# Patient Record
Sex: Male | Born: 1975 | Race: White | Hispanic: No | Marital: Married | Smoking: Never smoker
Health system: Southern US, Community
[De-identification: ages and names within clinical notes are randomized; demographics above are authoritative.]

## PROBLEM LIST (undated history)

## (undated) DIAGNOSIS — F4310 Post-traumatic stress disorder, unspecified: Secondary | ICD-10-CM

## (undated) DIAGNOSIS — F32A Depression, unspecified: Secondary | ICD-10-CM

## (undated) DIAGNOSIS — R519 Headache, unspecified: Secondary | ICD-10-CM

## (undated) DIAGNOSIS — T8859XA Other complications of anesthesia, initial encounter: Secondary | ICD-10-CM

## (undated) DIAGNOSIS — G4730 Sleep apnea, unspecified: Secondary | ICD-10-CM

## (undated) DIAGNOSIS — J45909 Unspecified asthma, uncomplicated: Secondary | ICD-10-CM

## (undated) DIAGNOSIS — F329 Major depressive disorder, single episode, unspecified: Secondary | ICD-10-CM

## (undated) DIAGNOSIS — R7303 Prediabetes: Secondary | ICD-10-CM

## (undated) DIAGNOSIS — Z9284 Personal history of unintended awareness under general anesthesia: Secondary | ICD-10-CM

## (undated) DIAGNOSIS — K219 Gastro-esophageal reflux disease without esophagitis: Secondary | ICD-10-CM

## (undated) DIAGNOSIS — F419 Anxiety disorder, unspecified: Secondary | ICD-10-CM

## (undated) DIAGNOSIS — J189 Pneumonia, unspecified organism: Secondary | ICD-10-CM

## (undated) HISTORY — PX: OTHER SURGICAL HISTORY: SHX169

## (undated) HISTORY — PX: FASCIOTOMY: SHX132

---

## 2002-08-12 DIAGNOSIS — S069XAA Unspecified intracranial injury with loss of consciousness status unknown, initial encounter: Secondary | ICD-10-CM

## 2002-08-12 HISTORY — DX: Unspecified intracranial injury with loss of consciousness status unknown, initial encounter: S06.9XAA

## 2014-08-12 HISTORY — PX: UMBILICAL HERNIA REPAIR: SHX196

## 2015-05-22 DIAGNOSIS — M4722 Other spondylosis with radiculopathy, cervical region: Secondary | ICD-10-CM | POA: Insufficient documentation

## 2015-05-22 DIAGNOSIS — M542 Cervicalgia: Secondary | ICD-10-CM | POA: Insufficient documentation

## 2015-06-26 ENCOUNTER — Emergency Department (HOSPITAL_COMMUNITY)
Admission: EM | Admit: 2015-06-26 | Discharge: 2015-06-26 | Disposition: A | Discharge status: Home / Self Care | Payer: Non-veteran care | Attending: Emergency Medicine | Admitting: Emergency Medicine

## 2015-06-26 ENCOUNTER — Encounter (HOSPITAL_COMMUNITY): Payer: Self-pay | Admitting: *Deleted

## 2015-06-26 DIAGNOSIS — K0889 Other specified disorders of teeth and supporting structures: Secondary | ICD-10-CM | POA: Insufficient documentation

## 2015-06-26 DIAGNOSIS — Z8659 Personal history of other mental and behavioral disorders: Secondary | ICD-10-CM | POA: Insufficient documentation

## 2015-06-26 HISTORY — DX: Post-traumatic stress disorder, unspecified: F43.10

## 2015-06-26 HISTORY — DX: Depression, unspecified: F32.A

## 2015-06-26 HISTORY — DX: Anxiety disorder, unspecified: F41.9

## 2015-06-26 HISTORY — DX: Major depressive disorder, single episode, unspecified: F32.9

## 2015-06-26 MED ORDER — PENICILLIN V POTASSIUM 500 MG PO TABS: 500.0000 mg | ORAL_TABLET | Freq: Four times a day (QID) | ORAL | Status: AC

## 2015-06-26 MED ORDER — IBUPROFEN 800 MG PO TABS: 800.0000 mg | ORAL_TABLET | Freq: Three times a day (TID) | ORAL | Status: AC | PRN

## 2015-06-26 MED ORDER — ACETAMINOPHEN-CODEINE #3 300-30 MG PO TABS: 1.0000 | ORAL_TABLET | Freq: Four times a day (QID) | ORAL | Status: DC | PRN

## 2015-06-26 NOTE — ED Provider Notes (Signed)
CSN: [id number]     Arrival date & time 06/26/15  1305 History  By signing my name below, I, Ronney Lion, attest that this documentation has been prepared under the direction and in the presence of [facility], PA-C. Electronically Signed: Ronney Lion, ED Scribe. 06/26/2015. 4:28 PM.    Chief Complaint  Patient presents with  . Dental Pain   The history is provided by the patient. No language interpreter was used.    HPI Comments:  Curtis Christensen is a 39 y.o. male who presents to the Emergency Department complaining of gradual-onset, gradually worsening, constant, shooting, severe pain to his upper and bottom teeth after they began breaking and splitting over the past couple of months as part of a natural decay process, per patient. He states he had dental work done on these teeth at the [place], but "they did a poor job," and all the dental work seems to be breaking. He states the nerves are exposed, and exposure to air or cold/hot liquids exacerbate his pain. Patient states he has had to take ibuprofen every 6-8 hours, for his pain, with mild relief. Patient states he presents today because he cannot be seen again at the [place] until 07/05/15. He denies fever, sore throat, difficulty breathing, difficulty swallowing, facial swelling, or blood in his stool.   Past Medical History  Diagnosis Date  . PTSD (post-traumatic stress disorder)   . Anxiety   . Depression    History reviewed. No pertinent past surgical history. History reviewed. No pertinent family history. Social History  Substance Use Topics  . Smoking status: Unknown If Ever Smoked  . Smokeless tobacco: None  . Alcohol Use: No    Review of Systems  Constitutional: Negative for fever.  HENT: Positive for dental problem. Negative for facial swelling, sore throat and trouble swallowing.   Respiratory: Negative for shortness of breath.   Gastrointestinal: Negative for blood in stool.  Musculoskeletal: Negative for neck pain and neck  stiffness.  Skin: Negative for color change and rash.  Allergic/Immunologic: Negative for immunocompromised state.  Psychiatric/Behavioral: Negative for self-injury.   Allergies  Review of patient's allergies indicates no known allergies.  Home Medications   Prior to Admission medications   Not on File   BP 136/86 mmHg  Pulse 76  Temp(Src) 98.1 F (36.7 C) (Oral)  Resp 18  SpO2 95% Physical Exam  Constitutional: He appears well-developed and well-nourished. No distress.  HENT:  Head: Normocephalic and atraumatic.  Mouth/Throat: Uvula is midline and oropharynx is clear and moist. Mucous membranes are not dry. No uvula swelling. No oropharyngeal exudate, posterior oropharyngeal edema, posterior oropharyngeal erythema or tonsillar abscesses.  Right upper second molar with remote posterior fracture. Tenderness to strong percussion. Right lower molars with significant gingival recession. Non-tender. No facial swelling. No other right-sided molars present.  Neck: Normal range of motion. Neck supple.  Cardiovascular: Normal rate.   Pulmonary/Chest: Effort normal and breath sounds normal. No stridor.  Lymphadenopathy:    He has no cervical adenopathy.  Neurological: He is alert.  Skin: He is not diaphoretic.  Nursing note and vitals reviewed.   ED Course  Procedures (including critical care time)  DIAGNOSTIC STUDIES: Oxygen Saturation is 95% on RA, normal by my interpretation.    COORDINATION OF CARE: 2:10 PM - Discussed treatment plan with pt at bedside which includes antibiotics and pain-relieving medication. Pt verbalized understanding and agreed to plan.   MDM   Final diagnoses:  Pain, dental    Afebrile, nontoxic  patient with ongoing, progressive dental pain.  No obvious abscess.  No concerning findings on exam.  Doubt deep space head or neck infection.  Doubt Ludwig's angina.  D/C home with antibiotic, pain medication and dental follow up.  Discussed findings,  treatment, and follow up  with patient.  Pt given return precautions.  Pt verbalizes understanding and agrees with plan.       I personally performed the services described in this documentation, which was scribed in my presence. The recorded information has been reviewed and is accurate.     Trixie Dredgemily Chabely Norby, PA-C 06/26/15 2151  Gerhard Munchobert Lockwood, MD 06/27/15 [phone]

## 2015-06-26 NOTE — Discharge Instructions (Signed)
Read the information below.  Use the prescribed medication as directed.  Please discuss all new medications with your pharmacist.  You may return to the Emergency Department at any time for worsening condition or any new symptoms that concern you.   Please call the dentist listed above within 48 hours to schedule a close follow up appointment.  If you develop fevers, swelling in your face, difficulty swallowing or breathing, return to the ER immediately for a recheck.   ° ° °Emergency Department Resource Guide °1) Find a Doctor and Pay Out of Pocket °Although you won't have to find out who is covered by your insurance plan, it is a good idea to ask around and get recommendations. You will then need to call the office and see if the doctor you have chosen will accept you as a new patient and what types of options they offer for patients who are self-pay. Some doctors offer discounts or will set up payment plans for their patients who do not have insurance, but you will need to ask so you aren't surprised when you get to your appointment. ° °2) Contact Your Local Health Department °Not all health departments have doctors that can see patients for sick visits, but many do, so it is worth a call to see if yours does. If you don't know where your local health department is, you can check in your phone book. The CDC also has a tool to help you locate your state's health department, and many state websites also have listings of all of their local health departments. ° °3) Find a Walk-in Clinic °If your illness is not likely to be very severe or complicated, you may want to try a walk in clinic. These are popping up all over the country in pharmacies, drugstores, and shopping centers. They're usually staffed by nurse practitioners or physician assistants that have been trained to treat common illnesses and complaints. They're usually fairly quick and inexpensive. However, if you have serious medical issues or chronic  medical problems, these are probably not your best option. ° °No Primary Care Doctor: °- Call [facility] at  832-8000 - they can help you locate a primary care doctor that  accepts your insurance, provides certain services, etc. °- Physician Referral Service- 1-800-533-3463 ° °Chronic Pain Problems: °Organization         Address  Phone   Notes  °[facility]  (336) 297-2271 Patients need to be referred by their primary care doctor.  ° °Medication Assistance: °Organization         Address  Phone   Notes  °[place] [facility] [address] °[place], [place] [address] (336) 641-8030 --Must be a resident of [place] °-- Must have NO insurance coverage whatsoever (no Medicaid/ Medicare, etc.) °-- The pt. MUST have a primary care doctor that directs their care regularly and follows them in the community °  °[facility]  (866) 331-1348   °[employer]  (888) 892-1162   ° °Agencies that provide inexpensive medical care: °Organization         Address  Phone   Notes  °[facility]  (336) 832-8035   ° Internal Medicine    (336) 832-7272   °[facility] [address] °[place], [place] [address] (336) 832-4777   °[facility] [address], °[place] (336) 271-4999   °[facility]    (336) 373-0678   °[facility]    (336) 272-1050   °[facility] ° [address], [place] Phone:  (336) 832-4444, Fax:  (336) 832-4440 Hours of Operation:  9 am - 6 pm, M-F.  Also accepts Medicaid/Medicare and self-pay.  °[facility] ° [address], [place] Phone: (336) 832-3150, Fax: (336) 832-3151. Hours of Operation:  8:30 am - 5:30 pm, M-F.  Also accepts Medicaid and self-pay.  °[facility] [place] [address], [place] Phone: (336) 878-6027   °[facility] [address], [place], [place] (336)723-1848,  Ext. 123 Mondays & Thursdays: 7-9 AM.  First 15 patients are seen on a first come, first serve basis. °  ° °Medicaid-accepting [place] Providers: ° °Organization         Address  Phone   Notes  °[facility] [address], [place] (336) 641-2100 Also accepts self-pay patients.  °[facility] [address], [place] ° (336) 856-9996   °[facility] [address], [place] (336) 288-8857   °[facility] [address] [place] [address], [place] (336) 299-7000   °Veita Bland [address], [place]  ° (336) 373-1557 Only accepts [place] Access Medicaid patients after they have their name applied to their card.  ° °Self-Pay (no insurance) in [place]: ° °Organization         Address  Phone   Notes  °Sickle Cell Patients, [facility] [address], [place] (336) 832-1970   °[facility] [address], [place] (336) 832-4400   °[facility] [place] ° [address], [place] (336) 992-4800   °[facility]/Dr. Osei-Bonsu ° [address] [place] [address], [place] or [address], [place] (336) 841-8500 Phone number for both [place] and [place] locations is the same.  °[facility] [address], [place] (336) 299-0000   °[facility] [address] [place] [address],  or [address] (336) 852-7530 °(336) 878-2260   °[facility] [address],  (336) 350-1642, phone; (336) 294-5005, fax Sees patients 1st and 3rd Saturday of every month.  Must not qualify for public or private insurance (i.e. Medicaid, Medicare, [place] Health Choice, Veterans' Benefits) • Household income should be no more than 200% of the poverty level •The clinic cannot treat you if you are pregnant or think you are pregnant • Sexually transmitted diseases are not  treated at the clinic.  ° ° °Dental Care: °Organization         Address  Phone  Notes  °[facility] [facility] [address],  (336) 641-6152 Accepts children up to age 21 who are enrolled in Medicaid or [place] Health Choice; pregnant women with a Medicaid card; and children who have applied for Medicaid or [place] Health Choice, but were declined, whose parents can pay a reduced fee at time of service.  °[facility] [place]  [address], [place] (336) 641-7733 Accepts children up to age 21 who are enrolled in Medicaid or [place] Health Choice; pregnant women with a Medicaid card; and children who have applied for Medicaid or [place] Health Choice, but were declined, whose parents can pay a reduced fee at time of service.  °  [facility] ° [address], [place] (336) 641-4533 Patients are seen by appointment only. Walk-ins are not accepted. [facility] will see patients 18 years of age and older. °Monday - Tuesday (8am-5pm) °Most Wednesdays (8:30-5pm) °$30 per visit, cash only  °[facility] ° [address], [place] (336) 641-4533 Patients are seen by appointment only. Walk-ins are not accepted. [facility] will see patients 18 years of age and older. °One Wednesday Evening (Monthly: Volunteer Based).  $30 per visit, cash only  °[facility]  (919) 537-3737 for adults; Children under age 4, call Graduate Pediatric Dentistry at (919) [phone]. Children aged 4-14, please call (919) 537-3737 to request a pediatric application. ° Dental services are provided in all areas of dental care including fillings, crowns and bridges, complete and partial dentures, implants, gum treatment, root canals, and extractions. Preventive care is also provided. Treatment is provided to both adults and children. °Patients are selected via a lottery and there is  often a waiting list. °  °[facility] [address], °[place] ° (336) 763-8833 [email or web address] °  °[facility] [address], [place], [place] (336)723-1848, Ext. 123 Second and Fourth Thursday of each month, opens at 6:30 AM; Clinic ends at 9 AM.  Patients are seen on a first-come first-served basis, and a limited number are seen during each clinic.  ° °[facility] ° [address], [place], [place] (336) 723-7904   Eligibility Requirements °You must have lived in [place], [place], or [place] counties for at least the last three months. °  You cannot be eligible for state or federal sponsored healthcare insurance, including Veterans Administration, Medicaid, or Medicare. °  You generally cannot be eligible for healthcare insurance through your employer.  °  How to apply: °Eligibility screenings are held every Tuesday and Wednesday afternoon from 1:00 pm until 4:00 pm. You do not need an appointment for the interview!  °[facility] [address], [place], [place] 336-631-2330   °[facility] [facility]  336-342-8273   °[facility]  336-703-3100   °[facility]  336-570-6415   ° °Behavioral Health Resources in the Community: °Intensive Outpatient Programs °Organization         Address  Phone  Notes  °[facility] [address], [place], [place] 336-878-6098   °[facility] [address], [place], [place] 336-832-9800   °[facility] [address], [place], [place] ° 336-882-2125   °[facility] [address],  °[place], [place] 1-800-853-5163 or 336-641-4981   °Substance Abuse Resources °Organization         Address  Phone  Notes  °[facility]  336-882-2125   °[facility]  336-784-9470   °[facility]  336-285-9073   °[facility]  336-845-3988   °Residential & Outpatient Substance  Abuse Program  1-800-659-3381   °Psychological Services °Organization         Address  Phone  Notes  °[facility]  336- 832-9600   °[facility]  336- 378-7881   °[facility] [address], [place] 1-800-853-5163 or 336-641-4981   ° °Mobile Crisis Teams °Organization         Address  Phone  Notes  °[facility], Mobile Crisis Care Unit  1-877-626-1772   °Assertive °Psychotherapeutic Services ° [address], [place] 336-834-9664   °Sharon DeEsch [address] °[place] 336-554-5454   ° °Self-Help/Support Groups °Organization         Address  Phone             Notes  °[facility] [place] - variety of support groups  336- 373-1402 Call for more information  °Narcotics Anonymous (NA), [facility] [address], °[place]  2 meetings at this location  ° °Residential Treatment Programs °Organization         Address  Phone  Notes  °[facility] [address],    °[place]  1-866-801-8205   °[facility] ° [address], [place], [place] 704-293-8524   °[facility] [address], [place] 336-845-3988 Admissions: 8am-3pm M-F  °[facility] [address]    °[place], [place] 336-841-1104   °[facility] [address], [place], [place] 336-379-7146   °[facility] [address]  °[place], [place] 336-285-9073   °[facility] - Intensive Outpatient [address], [place], [place] 336-852-3033   °[facility] [address]  °[place], [place] 1-877-615-2722 or 336-784-9470   °[facility] [address] [place], [place] 336-227-7417 Accepts Medicaid  °[facility] [address]  ° [place] 1-800-659-3381 Substance Abuse/Addiction Treatment  ° °[place] Behavioral Health Resources °Organization          Address  Phone  Notes  °[facility]  (888) 581-9988   °Julie Brannon, PhD [address] [place], [place]   (336) 349-5553 or (336) 951-0000   °[facility]   [address] °[place], [place] (336) 349-4454   °[facility] [address], [place], [place] (336) 342-8316 Insurance/Medicaid/sponsorship through [facility]  °[facility] [address]                                    [place], [place] (336) 342-8316 Therapy/tele-psych/case  °[facility] [address]  ° [place], [place] (336) 349-2233    °Dr. Arfeen  (336) 349-4544   °[facility] [place]  [employer] [place] [facility] 1) [address], [place] °2) [address], [place] °3)  [address], [place] (336) 349-3220 °(336) 342-7768 ° °(336) 342-8140   °[place] Child Abuse [facility] (336) 342-1394 or (336) 342-3537 (After Hours)    ° ° ° °

## 2018-07-28 DIAGNOSIS — G5601 Carpal tunnel syndrome, right upper limb: Secondary | ICD-10-CM | POA: Insufficient documentation

## 2019-08-13 HISTORY — PX: CERVICAL FUSION: SHX112

## 2019-12-05 ENCOUNTER — Emergency Department
Admission: EM | Admit: 2019-12-05 | Discharge: 2019-12-05 | Disposition: A | Discharge status: Home / Self Care | Payer: No Typology Code available for payment source | Attending: Emergency Medicine | Admitting: Emergency Medicine

## 2019-12-05 ENCOUNTER — Other Ambulatory Visit: Payer: Self-pay

## 2019-12-05 DIAGNOSIS — Y9389 Activity, other specified: Secondary | ICD-10-CM | POA: Diagnosis not present

## 2019-12-05 DIAGNOSIS — Y999 Unspecified external cause status: Secondary | ICD-10-CM | POA: Insufficient documentation

## 2019-12-05 DIAGNOSIS — T1591XA Foreign body on external eye, part unspecified, right eye, initial encounter: Secondary | ICD-10-CM

## 2019-12-05 DIAGNOSIS — T1501XA Foreign body in cornea, right eye, initial encounter: Secondary | ICD-10-CM | POA: Insufficient documentation

## 2019-12-05 DIAGNOSIS — Y929 Unspecified place or not applicable: Secondary | ICD-10-CM | POA: Insufficient documentation

## 2019-12-05 DIAGNOSIS — X58XXXA Exposure to other specified factors, initial encounter: Secondary | ICD-10-CM | POA: Diagnosis not present

## 2019-12-05 DIAGNOSIS — H5711 Ocular pain, right eye: Secondary | ICD-10-CM | POA: Diagnosis present

## 2019-12-05 MED ORDER — TETRACAINE HCL 0.5 % OP SOLN
2.0000 [drp] | Freq: Once | OPHTHALMIC | Status: DC
  Filled 2019-12-05: qty 4

## 2019-12-05 MED ORDER — POLYMYXIN B-TRIMETHOPRIM 10000-0.1 UNIT/ML-% OP SOLN: 2.0000 [drp] | Freq: Four times a day (QID) | OPHTHALMIC | 0 refills | Status: DC

## 2019-12-05 MED ORDER — FLUORESCEIN SODIUM 1 MG OP STRP
1.0000 | ORAL_STRIP | Freq: Once | OPHTHALMIC | Status: DC
  Filled 2019-12-05: qty 1

## 2019-12-05 MED ORDER — KETOROLAC TROMETHAMINE 0.5 % OP SOLN: 1.0000 [drp] | Freq: Four times a day (QID) | OPHTHALMIC | 0 refills | Status: DC

## 2019-12-05 NOTE — Discharge Instructions (Signed)
Use the antibiotic eyedrop, 20 to 30 minutes later use the Acular drops for symptom improvement.  After 20 or 30 minutes you may use as much Visine/normal saline as you would like for additional symptom relief.  Foreign body sensation will probably last 48 to 72 hours.  If you have any further difficulty, ongoing foreign body sensation, pain follow-up with ophthalmology as needed.

## 2019-12-05 NOTE — ED Notes (Signed)
Pt states that he feels like he has something in his right eye- pt states that he works with [employer] and may have wiped his eye and gotten a piece of metal in there- pt states that his wife could not see anything in it- pt right eye red and slightly swollen

## 2019-12-05 NOTE — ED Provider Notes (Signed)
[facility] Emergency Department Provider Note  ____________________________________________  Time seen: Approximately 5:39 PM  I have reviewed the triage vital signs and the nursing notes.   HISTORY  Chief Complaint Eye Pain    HPI Curtis Christensen is a 44 y.o. male who presents the emergency department complaining of right eye pain/foreign body sensation.  Patient states that he was using a grinding wheel while working on his truck yesterday.  Patient has safety glasses on but states that he believes that a piece of metal may have bounced underneath the glass and got in his eye.  Patient does not wear glasses or contacts normally.  No vision changes.  Patient has a foreign body sensation in the superior aspect of his eye.  Patient has had increased tearing but no drainage.  No other complaints.         Past Medical History:  Diagnosis Date  . Anxiety   . Depression   . PTSD (post-traumatic stress disorder)     There are no problems to display for this patient.   History reviewed. No pertinent surgical history.  Prior to Admission medications   Medication Sig Start Date End Date Taking? Authorizing Provider  acetaminophen-codeine (TYLENOL #3) 300-30 MG tablet Take 1-2 tablets by mouth every 6 (six) hours as needed for moderate pain. 06/26/15   Trixie Dredge, PA-C  ibuprofen (ADVIL,MOTRIN) 800 MG tablet Take 1 tablet (800 mg total) by mouth every 8 (eight) hours as needed for mild pain or moderate pain. 06/26/15   Trixie Dredge, PA-C  ketorolac (ACULAR) 0.5 % ophthalmic solution Place 1 drop into the right eye 4 (four) times daily. 12/05/19   Pete Merten, Delorise Royals, PA-C  trimethoprim-polymyxin b (POLYTRIM) ophthalmic solution Place 2 drops into the right eye every 6 (six) hours. 12/05/19   Zaineb Nowaczyk, Delorise Royals, PA-C    Allergies Patient has no known allergies.  No family history on file.  Social History Social History   Tobacco Use  . Smoking status:  Never Smoker  . Smokeless tobacco: Never Used  Substance Use Topics  . Alcohol use: No  . Drug use: No     Review of Systems  Constitutional: No fever/chills Eyes: Possible foreign body in the right eye ENT: No upper respiratory complaints. Cardiovascular: no chest pain. Respiratory: no cough. No SOB. Gastrointestinal: No abdominal pain.  No nausea, no vomiting.  No diarrhea.  No constipation. Musculoskeletal: Negative for musculoskeletal pain. Skin: Negative for rash, abrasions, lacerations, ecchymosis. Neurological: Negative for headaches, focal weakness or numbness. 10-point ROS otherwise negative.  ____________________________________________   PHYSICAL EXAM:  VITAL SIGNS: ED Triage Vitals  Enc Vitals Group     BP 12/05/19 1623 (!) 153/88     Pulse Rate 12/05/19 1623 77     Resp 12/05/19 1623 16     Temp 12/05/19 1623 98.5 F (36.9 C)     Temp Source 12/05/19 1623 Oral     SpO2 12/05/19 1623 98 %     Weight 12/05/19 1621 215 lb (97.5 kg)     Height 12/05/19 1621 5\' 7"  (1.702 m)     Head Circumference --      Peak Flow --      Pain Score 12/05/19 1621 3     Pain Loc --      Pain Edu? --      Excl. in GC? --      Constitutional: Alert and oriented. Well appearing and in no acute distress. Eyes: Conjunctivae are normal.  PERRL. EOMI. patient's eye is anesthetized using tetracaine.  Fluorescein staining applied over the area of uptake around visible foreign body in the 11:30 position.  No other areas of uptake.  This area is visualized on funduscopic exam as well.  Funduscopic exam reveals good red reflex, vasculature and optic disc is intact.  No evidence of hyphema. Head: Atraumatic. ENT:      Ears:       Nose: No congestion/rhinnorhea.      Mouth/Throat: Mucous membranes are moist.  Neck: No stridor.    Cardiovascular: Normal rate, regular rhythm. Normal S1 and S2.  Good peripheral circulation. Respiratory: Normal respiratory effort without tachypnea or  retractions. Lungs CTAB. Good air entry to the bases with no decreased or absent breath sounds. Musculoskeletal: Full range of motion to all extremities. No gross deformities appreciated. Neurologic:  Normal speech and language. No gross focal neurologic deficits are appreciated.  Skin:  Skin is warm, dry and intact. No rash noted. Psychiatric: Mood and affect are normal. Speech and behavior are normal. Patient exhibits appropriate insight and judgement.   ____________________________________________   LABS (all labs ordered are listed, but only abnormal results are displayed)  Labs Reviewed - No data to display ____________________________________________  EKG   ____________________________________________  RADIOLOGY   No results found.  ____________________________________________    PROCEDURES  Procedure(s) performed:    .Foreign Body Removal  Date/Time: 12/05/2019 5:42 PM Performed by: Racheal Patches, PA-C Authorized by: Racheal Patches, PA-C  Consent: Verbal consent obtained. Risks and benefits: risks, benefits and alternatives were discussed Consent given by: patient Patient understanding: patient states understanding of the procedure being performed Patient identity confirmed: verbally with patient Time out: Immediately prior to procedure a "time out" was called to verify the correct patient, procedure, equipment, support staff and site/side marked as required. Body area: eye Location details: right cornea  Anesthesia: Local Anesthetic: tetracaine drops  Sedation: Patient sedated: no  Patient restrained: no Patient cooperative: yes Localization method: eyelid eversion and visualized Removal mechanism: 25-gauge needle Eye examined with fluorescein. Fluorescein uptake. Corneal abrasion size: small Corneal abrasion location: superior Residual rust ring present. Residual rust ring removed. Dressing: antibiotic drops Depth:  embedded Complexity: simple 1 objects recovered. Objects recovered: small metal fb Post-procedure assessment: foreign body removed Patient tolerance: patient tolerated the procedure well with no immediate complications Comments: Small piece of metal identified in the 11:30 position of the eye over the iris itself.  Small area of uptake around this foreign body.  No other fluorescein uptake.  Foreign body is successfully removed using 25-gauge needle.  There was a residual rust ring but this was also removed with 25-gauge needle..  Patient tolerated well no complications.      Medications  tetracaine (PONTOCAINE) 0.5 % ophthalmic solution 2 drop (has no administration in time range)  fluorescein ophthalmic strip 1 strip (has no administration in time range)     ____________________________________________   INITIAL IMPRESSION / ASSESSMENT AND PLAN / ED COURSE  Pertinent labs & imaging results that were available during my care of the patient were reviewed by me and considered in my medical decision making (see chart for details).  Review of the [place] CSRS was performed in accordance of the [facility] prior to dispensing any controlled drugs.           Patient's diagnosis is consistent with foreign body of the right eye.  Patient presented to emergency department complaining of right eye pain/foreign body sensation.  Foreign body was  visualized in the 11:30 position of the right eye.  Foreign body was successfully removed as well as a small rust ring.  Patient tolerated well no complications.  I have instructed the patient on the use of his prescribed medications and to follow-up with ophthalmology as needed. Patient is given ED precautions to return to the ED for any worsening or new symptoms.     ____________________________________________  FINAL CLINICAL IMPRESSION(S) / ED DIAGNOSES  Final diagnoses:  Foreign body of right eye, initial encounter      NEW MEDICATIONS STARTED  DURING THIS VISIT:  ED Discharge Orders         Ordered    trimethoprim-polymyxin b (POLYTRIM) ophthalmic solution  Every 6 hours     12/05/19 1748    ketorolac (ACULAR) 0.5 % ophthalmic solution  4 times daily     12/05/19 1748              This chart was dictated using voice recognition software/Dragon. Despite best efforts to proofread, errors can occur which can change the meaning. Any change was purely unintentional.    Darletta Moll, PA-C 12/05/19 1749    Blake Divine, MD 12/06/19 [phone]

## 2019-12-05 NOTE — ED Triage Notes (Signed)
Pt reports yesterday he was grinding things under his truck and some of the sparks got into right eye - Pt feels like "something" is in his eye - the right eye is red/irritated

## 2019-12-05 NOTE — ED Notes (Signed)
Pt did not have glasses for visual acuity   

## 2020-08-30 DIAGNOSIS — M5010 Cervical disc disorder with radiculopathy, unspecified cervical region: Secondary | ICD-10-CM | POA: Insufficient documentation

## 2020-11-29 DIAGNOSIS — R030 Elevated blood-pressure reading, without diagnosis of hypertension: Secondary | ICD-10-CM | POA: Insufficient documentation

## 2021-01-18 ENCOUNTER — Other Ambulatory Visit: Payer: Self-pay

## 2021-01-18 ENCOUNTER — Ambulatory Visit (INDEPENDENT_AMBULATORY_CARE_PROVIDER_SITE_OTHER)
Admission: EM | Admit: 2021-01-18 | Discharge: 2021-01-18 | Disposition: A | Discharge status: Home / Self Care | Payer: No Typology Code available for payment source | Source: Home / Self Care

## 2021-01-18 ENCOUNTER — Encounter (HOSPITAL_COMMUNITY): Payer: Self-pay | Admitting: Pharmacy Technician

## 2021-01-18 ENCOUNTER — Emergency Department (HOSPITAL_COMMUNITY)
Admission: EM | Admit: 2021-01-18 | Discharge: 2021-01-18 | Disposition: A | Discharge status: Home / Self Care | Payer: No Typology Code available for payment source | Attending: Emergency Medicine | Admitting: Emergency Medicine

## 2021-01-18 ENCOUNTER — Emergency Department (HOSPITAL_COMMUNITY): Payer: No Typology Code available for payment source

## 2021-01-18 DIAGNOSIS — R55 Syncope and collapse: Secondary | ICD-10-CM | POA: Insufficient documentation

## 2021-01-18 DIAGNOSIS — F411 Generalized anxiety disorder: Secondary | ICD-10-CM | POA: Insufficient documentation

## 2021-01-18 DIAGNOSIS — F4310 Post-traumatic stress disorder, unspecified: Secondary | ICD-10-CM | POA: Insufficient documentation

## 2021-01-18 DIAGNOSIS — R61 Generalized hyperhidrosis: Secondary | ICD-10-CM | POA: Diagnosis not present

## 2021-01-18 DIAGNOSIS — R079 Chest pain, unspecified: Secondary | ICD-10-CM

## 2021-01-18 DIAGNOSIS — R0789 Other chest pain: Secondary | ICD-10-CM | POA: Insufficient documentation

## 2021-01-18 LAB — CBC WITH DIFFERENTIAL/PLATELET
Abs Immature Granulocytes: 0.02 10*3/uL (ref 0.00–0.07)
Basophils Absolute: 0.1 10*3/uL (ref 0.0–0.1)
Basophils Relative: 1 %
Eosinophils Absolute: 0.3 10*3/uL (ref 0.0–0.5)
Eosinophils Relative: 2 %
HCT: 42 % (ref 39.0–52.0)
Hemoglobin: 14.6 g/dL (ref 13.0–17.0)
Immature Granulocytes: 0 %
Lymphocytes Relative: 18 %
Lymphs Abs: 2 10*3/uL (ref 0.7–4.0)
MCH: 32.2 pg (ref 26.0–34.0)
MCHC: 34.8 g/dL (ref 30.0–36.0)
MCV: 92.5 fL (ref 80.0–100.0)
Monocytes Absolute: 0.6 10*3/uL (ref 0.1–1.0)
Monocytes Relative: 6 %
Neutro Abs: 8.1 10*3/uL — ABNORMAL HIGH (ref 1.7–7.7)
Neutrophils Relative %: 73 %
Platelets: 249 10*3/uL (ref 150–400)
RBC: 4.54 MIL/uL (ref 4.22–5.81)
RDW: 12.1 % (ref 11.5–15.5)
WBC: 11.1 10*3/uL — ABNORMAL HIGH (ref 4.0–10.5)
nRBC: 0 % (ref 0.0–0.2)

## 2021-01-18 LAB — COMPREHENSIVE METABOLIC PANEL
ALT: 13 U/L (ref 0–44)
AST: 16 U/L (ref 15–41)
Albumin: 4 g/dL (ref 3.5–5.0)
Alkaline Phosphatase: 64 U/L (ref 38–126)
Anion gap: 9 (ref 5–15)
BUN: 14 mg/dL (ref 6–20)
CO2: 23 mmol/L (ref 22–32)
Calcium: 9.4 mg/dL (ref 8.9–10.3)
Chloride: 106 mmol/L (ref 98–111)
Creatinine, Ser: 1.14 mg/dL (ref 0.61–1.24)
GFR, Estimated: >60 mL/min (ref >60)
Glucose, Bld: 153 mg/dL — ABNORMAL HIGH (ref 70–99)
Potassium: 3.8 mmol/L (ref 3.5–5.1)
Sodium: 138 mmol/L (ref 135–145)
Total Bilirubin: 0.6 mg/dL (ref 0.3–1.2)
Total Protein: 6.8 g/dL (ref 6.5–8.1)

## 2021-01-18 LAB — TROPONIN I (HIGH SENSITIVITY): Troponin I (High Sensitivity): 3 ng/L (ref <18)

## 2021-01-18 NOTE — ED Triage Notes (Signed)
Patient presents to Urgent Care with complaints of chest tightness from treatment last Thursday. Pt states possible panic attack since last Thursday. He states anxiety has increased since hyperbaric procedure. He reports under a lot of stress at this time. Denies chest pain, SOB at this time.

## 2021-01-18 NOTE — ED Provider Notes (Signed)
[facility] EMERGENCY DEPARTMENT Provider Note   CSN: [id number] Arrival date & time: 01/18/21  1617     History Chief Complaint  Patient presents with   Chest Pain    Curtis Christensen is a 45 y.o. male.   Chest Pain Pt had an episode 1 week ago where he had chest discomfort in his chest, tightness, diaphoresis and feeling anxious.  He also had a syncopal episode.  He had another episode today and has had intermittent episodes since then HPI: A 45 year old patient with a history of hypercholesterolemia presents for evaluation of chest pain. Initial onset of pain was approximately 1-3 hours ago. The patient's chest pain is described as heaviness/pressure/tightness and is not worse with exertion. The patient complains of nausea and reports some diaphoresis. The patient's chest pain is middle- or left-sided, is not well-localized, is not sharp and does not radiate to the arms/jaw/neck. The patient has no history of stroke, has no history of peripheral artery disease, has not smoked in the past 90 days, denies any history of treated diabetes, has no relevant family history of coronary artery disease (first degree relative at less than age 59), is not hypertensive and does not have an elevated BMI (>=30).   Past Medical History:  Diagnosis Date   Anxiety    Depression    PTSD (post-traumatic stress disorder)     There are no problems to display for this patient.   History reviewed. No pertinent surgical history.     No family history on file.  Social History   Tobacco Use   Smoking status: Never   Smokeless tobacco: Never  Substance Use Topics   Alcohol use: No   Drug use: No    Home Medications Prior to Admission medications   Medication Sig Start Date End Date Taking? Authorizing Provider  acetaminophen-codeine (TYLENOL #3) 300-30 MG tablet Take 1-2 tablets by mouth every 6 (six) hours as needed for moderate pain. 06/26/15   Trixie Dredge, PA-C   ARIPiprazole (ABILIFY) 10 MG tablet Take 10 mg by mouth daily.    [provider]  atorvastatin (LIPITOR) 20 MG tablet Take 20 mg by mouth daily.    [provider]  cyclobenzaprine (FLEXERIL) 10 MG tablet Take 10 mg by mouth 3 (three) times daily as needed for muscle spasms.    [provider]  GABAPENTIN PO Take by mouth.    [provider]  ibuprofen (ADVIL,MOTRIN) 800 MG tablet Take 1 tablet (800 mg total) by mouth every 8 (eight) hours as needed for mild pain or moderate pain. 06/26/15   Trixie Dredge, PA-C  ketorolac (ACULAR) 0.5 % ophthalmic solution Place 1 drop into the right eye 4 (four) times daily. 12/05/19   Cuthriell, Delorise Royals, PA-C  omeprazole (PRILOSEC) 10 MG capsule Take 10 mg by mouth daily.    [provider]  TRAZODONE HCL PO Take by mouth.    [provider]  trimethoprim-polymyxin b (POLYTRIM) ophthalmic solution Place 2 drops into the right eye every 6 (six) hours. 12/05/19   Cuthriell, Delorise Royals, PA-C    Allergies    Buprenorphine-naloxone, Suboxone [buprenorphine hcl-naloxone hcl], Escitalopram oxalate, Lexapro [escitalopram], and Sertraline  Review of Systems   Review of Systems  Cardiovascular:  Positive for chest pain.  All other systems reviewed and are negative.  Physical Exam Updated Vital Signs BP (!) 134/94   Pulse 70   Temp 98.4 F (36.9 C) (Oral)   Resp (!) 28   SpO2  98%   Physical Exam Vitals and nursing note reviewed.  Constitutional:      General: He is not in acute distress.    Appearance: He is well-developed.  HENT:     Head: Normocephalic and atraumatic.     Right Ear: External ear normal.     Left Ear: External ear normal.  Eyes:     General: No scleral icterus.       Right eye: No discharge.        Left eye: No discharge.     Conjunctiva/sclera: Conjunctivae normal.  Neck:     Trachea: No tracheal deviation.  Cardiovascular:     Rate and Rhythm: Normal rate and regular  rhythm.  Pulmonary:     Effort: Pulmonary effort is normal. No respiratory distress.     Breath sounds: Normal breath sounds. No stridor. No wheezing or rales.  Abdominal:     General: Bowel sounds are normal. There is no distension.     Palpations: Abdomen is soft.     Tenderness: There is no abdominal tenderness. There is no guarding or rebound.  Musculoskeletal:        General: No tenderness or deformity.     Cervical back: Neck supple.  Skin:    General: Skin is warm and dry.     Findings: No rash.  Neurological:     General: No focal deficit present.     Mental Status: He is alert.     Cranial Nerves: No cranial nerve deficit (no facial droop, extraocular movements intact, no slurred speech).     Sensory: No sensory deficit.     Motor: No abnormal muscle tone or seizure activity.     Coordination: Coordination normal.  Psychiatric:        Mood and Affect: Mood normal.    ED Results / Procedures / Treatments   Labs (all labs ordered are listed, but only abnormal results are displayed) Labs Reviewed  CBC WITH DIFFERENTIAL/PLATELET - Abnormal; Notable for the following components:      Result Value   WBC 11.1 (*)    Neutro Abs 8.1 (*)    All other components within normal limits  COMPREHENSIVE METABOLIC PANEL - Abnormal; Notable for the following components:   Glucose, Bld 153 (*)    All other components within normal limits  TROPONIN I (HIGH SENSITIVITY)  TROPONIN I (HIGH SENSITIVITY)    EKG None  Radiology DG Chest 2 View  Result Date: 01/18/2021 CLINICAL DATA:  Shortness of breath and dizziness. EXAM: CHEST - 2 VIEW COMPARISON:  None. FINDINGS: The heart size and mediastinal contours are within normal limits. Both lungs are clear. A radiopaque fusion plate and screws are seen overlying the cervical spine. The visualized skeletal structures are otherwise unremarkable. IMPRESSION: No active cardiopulmonary disease. Electronically Signed   By: Aram Candela M.D.    On: 01/18/2021 17:26    Procedures Procedures   Medications Ordered in ED Medications - No data to display  ED Course  I have reviewed the triage vital signs and the nursing notes.  Pertinent labs & imaging results that were available during my care of the patient were reviewed by me and considered in my medical decision making (see chart for details).  Clinical Course as of 01/18/21 2218  Thu Jan 18, 2021  2213 Patient's initial laboratory tests are normal. [JK]    Clinical Course User Index [JK] Linwood Dibbles, MD   MDM Rules/Calculators/A&P HEAR Score: 4  Patient presented to the ED for evaluation of chest pain.  Patient does not have any history of heart disease but he does have cardiac risk factors.  Patient also has a history of anxiety and he was attributing his symptoms to possible panic attacks.  Initial ED work-up is reassuring.  No definite ischemic changes on EKG.  Initial troponin is normal.  I did want to check a delta troponin but the patient was very frustrated about the length of stay and wanted to leave.  [facility]  Final Clinical Impression(s) / ED Diagnoses Final diagnoses:  Chest pain, unspecified type    Rx / DC Orders ED Discharge Orders     None        Linwood Dibbles, MD 01/18/21 2218

## 2021-01-18 NOTE — Discharge Instructions (Addendum)
Go directly to [facility], ED for further evaluation

## 2021-01-18 NOTE — Discharge Instructions (Addendum)
Follow-up with your doctor at the [facility].  Consider stress testing for further evaluation

## 2021-01-18 NOTE — ED Triage Notes (Signed)
Pt sent here from UC with chest tightness initial onset Thursday. Pt states when it first started he was in the middle of a hyperbaric procedure. Pt states during that time he also became diaphoretic and then had a syncopal event. Pt states today he felt the same feeling coming on, but did not pass out. Pt endorses increased stressors over the last few weeks.

## 2021-01-18 NOTE — ED Provider Notes (Signed)
[facility]    CSN: [id number] Arrival date & time: 01/18/21  1500      History   Chief Complaint Chief Complaint  Patient presents with   Anxiety    HPI Malin Cervini is a 45 y.o. male.   Patient presenting today with his wife for complaints of 1 week history of intermittent chest tightness and pressure.  He states it feels like something is squeezing the center of his chest.  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  He states he had a hangover type feeling the entire next day but felt fairly okay several days after the episode.  He started having the pressure the past few days again and then this morning minutes after waking up the pressure became painful and he had to sit down to see that he was going to pass out again.  The pressure has not let up since this episode.  He states he has significant PTSD, anxiety and is wondering if these are panic attacks which she has never experienced before.  No known cardiac history or pulmonary history.  Has not tried anything over-the-counter for symptoms but is consistently taking his anxiety medications.  Wife is very concerned as she states he has never exhibited the symptoms in the past.  Lots of stressors at the moment but nothing acutely happening at the time of these episodes.   Past Medical History:  Diagnosis Date   Anxiety    Depression    PTSD (post-traumatic stress disorder)     There are no problems to display for this patient.   History reviewed. No pertinent surgical history.     Home Medications    Prior to Admission medications   Medication Sig Start Date End Date Taking? Authorizing Provider  ARIPiprazole (ABILIFY) 10 MG tablet Take 10 mg by mouth daily.    Yes [provider]  atorvastatin (LIPITOR) 20 MG tablet Take 20 mg by mouth daily.   Yes [provider]  cyclobenzaprine (FLEXERIL) 10 MG tablet Take 10 mg by mouth 3 (three) times daily as needed for muscle spasms.   Yes [provider]  omeprazole (PRILOSEC) 10 MG capsule Take 10 mg by mouth daily.   Yes [provider]  acetaminophen-codeine (TYLENOL #3) 300-30 MG tablet Take 1-2 tablets by mouth every 6 (six) hours as needed for moderate pain. 06/26/15   Trixie Dredge, PA-C  GABAPENTIN PO Take by mouth.    [provider]  ibuprofen (ADVIL,MOTRIN) 800 MG tablet Take 1 tablet (800 mg total) by mouth every 8 (eight) hours as needed for mild pain or moderate pain. 06/26/15   Trixie Dredge, PA-C  ketorolac (ACULAR) 0.5 % ophthalmic solution Place 1 drop into the right eye 4 (four) times daily. 12/05/19   Cuthriell, Delorise Royals, PA-C  TRAZODONE HCL PO Take by mouth.    [provider]  trimethoprim-polymyxin b (POLYTRIM) ophthalmic solution Place 2 drops into the right eye every 6 (six) hours. 12/05/19   Cuthriell, Delorise Royals, PA-C    Family History History reviewed. No pertinent family history.  Social History Social History   Tobacco Use   Smoking status: Never   Smokeless tobacco: Never  Substance Use Topics  Alcohol use: No   Drug use: No     Allergies   Buprenorphine-naloxone, Suboxone [buprenorphine hcl-naloxone hcl], Escitalopram oxalate, Lexapro [escitalopram], and Sertraline   Review of Systems Review of Systems Per HPI  Physical Exam Triage Vital Signs ED Triage Vitals  Enc Vitals Group     BP 01/18/21 1527 137/88     Pulse Rate 01/18/21 1527 94     Resp 01/18/21 1527 18     Temp 01/18/21 1527 99.1 F (37.3 C)     Temp Source 01/18/21 1527 Oral     SpO2 01/18/21 1527 94 %     Weight 01/18/21 1529 215 lb (97.5 kg)     Height --      Head Circumference --      Peak Flow --      Pain Score 01/18/21 1528 0      Pain Loc --      Pain Edu? --      Excl. in GC? --    No data found.  Updated Vital Signs BP 137/88 (BP Location: Left Arm)   Pulse 94   Temp 99.1 F (37.3 C) (Oral)   Resp 18   Wt 215 lb (97.5 kg)   SpO2 94%   BMI 33.67 kg/m   Visual Acuity Right Eye Distance:   Left Eye Distance:   Bilateral Distance:    Right Eye Near:   Left Eye Near:    Bilateral Near:     Physical Exam Vitals and nursing note reviewed.  Constitutional:      Appearance: Normal appearance.  HENT:     Head: Atraumatic.     Mouth/Throat:     Mouth: Mucous membranes are moist.  Eyes:     Extraocular Movements: Extraocular movements intact.     Conjunctiva/sclera: Conjunctivae normal.  Cardiovascular:     Rate and Rhythm: Normal rate and regular rhythm.     Heart sounds: Normal heart sounds.  Pulmonary:     Effort: Pulmonary effort is normal.     Breath sounds: Normal breath sounds. No wheezing or rales.  Abdominal:     General: Bowel sounds are normal. There is no distension.     Palpations: Abdomen is soft.     Tenderness: There is no abdominal tenderness. There is no guarding.  Musculoskeletal:        General: Normal range of motion.     Cervical back: Normal range of motion and neck supple.  Skin:    General: Skin is warm and dry.  Neurological:     Mental Status: He is oriented to person, place, and time.     Motor: No weakness.     Gait: Gait normal.  Psychiatric:        Thought Content: Thought content normal.        Judgment: Judgment normal.     Comments: Appears anxious   UC Treatments / Results  Labs (all labs ordered are listed, but only abnormal results are displayed) Labs Reviewed - No data to display  EKG   Radiology No results found.  Procedures Procedures (including critical care time)  Medications Ordered in UC Medications - No data to display  Initial Impression / Assessment and Plan / UC Course  I have reviewed the triage vital signs and the nursing  notes.  Pertinent labs & imaging results that were available during my care of the patient were reviewed by me and considered in my medical decision making (see chart for details).  Exam and vital signs overall reassuring today, EKG normal sinus rhythm at 79 bpm without acute ST or T wave changes however given sudden onset new chest pain and pressure, syncopal episode and presyncopal episode this morning do feel he warrants further evaluation at the emergency department as we are unable to rule out multiple life-threatening conditions in the setting.  Patient and wife very agreeable to this and wish to go via private vehicle to [facility], ED for this further evaluation.  He is currently hemodynamically stable for transport. Final Clinical Impressions(s) / UC Diagnoses   Final diagnoses:  Chest tightness  Syncope and collapse  Anxiety state  PTSD (post-traumatic stress disorder)     Discharge Instructions      Go directly to [facility], ED for further evaluation     ED Prescriptions   None    PDMP not reviewed this encounter.   Particia Nearing, [place] 01/18/21 1641

## 2021-01-18 NOTE — ED Provider Notes (Signed)
Emergency Medicine Provider Triage Evaluation Note  Curtis Christensen , a 45 y.o. male  was evaluated in triage.  Pt complains of CP.  Began 1 week ago He was doing hyperbaric oxygen therapy at that time.  Had syncopal episode at that time.  Pain initially improved however with persistent dull aching.  Was awoken by today with tightening sensation to center of his chest.  Became diaphoretic, nauseous, near syncope. No current pain. Hx of hypercholesterolemia. No hx of cardiac issues known  Review of Systems  Positive: CP, syncope, diaphoresis Negative: LE swelling  Physical Exam  BP 134/82 (BP Location: Right Arm)   Pulse 85   Temp 98.4 F (36.9 C) (Oral)   Resp 17   SpO2 98%  Gen:   Awake, no distress   Resp:  Normal effort  MSK:   Moves extremities without difficulty  Other:    Medical Decision Making  Medically screening exam initiated at 4:35 PM.  Appropriate orders placed.  Willmar Stockinger was informed that the remainder of the evaluation will be completed by another provider, this initial triage assessment does not replace that evaluation, and the importance of remaining in the ED until their evaluation is complete.  CP, syncope, nausea    Nello Corro A, PA-C 01/18/21 1637    Linwood Dibbles, MD 01/18/21 2324

## 2021-01-18 NOTE — ED Notes (Signed)
Patient verbalizes understanding of discharge instructions. Opportunity for questioning and answers were provided. Armband removed by staff, pt discharged from ED ambulatory.   

## 2021-03-20 ENCOUNTER — Encounter: Payer: Self-pay | Admitting: Physician Assistant

## 2021-03-20 ENCOUNTER — Other Ambulatory Visit: Payer: Self-pay

## 2021-03-20 ENCOUNTER — Ambulatory Visit (INDEPENDENT_AMBULATORY_CARE_PROVIDER_SITE_OTHER): Payer: No Typology Code available for payment source | Admitting: Physician Assistant

## 2021-03-20 VITALS — BP 115/73 | HR 75 | Temp 99.6°F | Ht 69.0 in | Wt 222.9 lb

## 2021-03-20 DIAGNOSIS — J309 Allergic rhinitis, unspecified: Secondary | ICD-10-CM | POA: Insufficient documentation

## 2021-03-20 DIAGNOSIS — Z87820 Personal history of traumatic brain injury: Secondary | ICD-10-CM

## 2021-03-20 DIAGNOSIS — F1110 Opioid abuse, uncomplicated: Secondary | ICD-10-CM | POA: Insufficient documentation

## 2021-03-20 DIAGNOSIS — F1220 Cannabis dependence, uncomplicated: Secondary | ICD-10-CM | POA: Insufficient documentation

## 2021-03-20 DIAGNOSIS — Z710 Person encountering health services to consult on behalf of another person: Secondary | ICD-10-CM | POA: Insufficient documentation

## 2021-03-20 DIAGNOSIS — K219 Gastro-esophageal reflux disease without esophagitis: Secondary | ICD-10-CM

## 2021-03-20 DIAGNOSIS — E785 Hyperlipidemia, unspecified: Secondary | ICD-10-CM

## 2021-03-20 DIAGNOSIS — Z0000 Encounter for general adult medical examination without abnormal findings: Secondary | ICD-10-CM

## 2021-03-20 DIAGNOSIS — F99 Mental disorder, not otherwise specified: Secondary | ICD-10-CM

## 2021-03-20 DIAGNOSIS — G4733 Obstructive sleep apnea (adult) (pediatric): Secondary | ICD-10-CM | POA: Insufficient documentation

## 2021-03-20 DIAGNOSIS — I209 Angina pectoris, unspecified: Secondary | ICD-10-CM | POA: Insufficient documentation

## 2021-03-20 DIAGNOSIS — M4722 Other spondylosis with radiculopathy, cervical region: Secondary | ICD-10-CM

## 2021-03-20 DIAGNOSIS — F5105 Insomnia due to other mental disorder: Secondary | ICD-10-CM

## 2021-03-20 DIAGNOSIS — Z7689 Persons encountering health services in other specified circumstances: Secondary | ICD-10-CM | POA: Diagnosis not present

## 2021-03-20 DIAGNOSIS — F419 Anxiety disorder, unspecified: Secondary | ICD-10-CM | POA: Diagnosis not present

## 2021-03-20 DIAGNOSIS — E781 Pure hyperglyceridemia: Secondary | ICD-10-CM | POA: Insufficient documentation

## 2021-03-20 DIAGNOSIS — H9319 Tinnitus, unspecified ear: Secondary | ICD-10-CM | POA: Insufficient documentation

## 2021-03-20 DIAGNOSIS — Z9852 Vasectomy status: Secondary | ICD-10-CM | POA: Insufficient documentation

## 2021-03-20 DIAGNOSIS — J45909 Unspecified asthma, uncomplicated: Secondary | ICD-10-CM | POA: Insufficient documentation

## 2021-03-20 DIAGNOSIS — K429 Umbilical hernia without obstruction or gangrene: Secondary | ICD-10-CM | POA: Insufficient documentation

## 2021-03-20 DIAGNOSIS — M79644 Pain in right finger(s): Secondary | ICD-10-CM | POA: Insufficient documentation

## 2021-03-20 DIAGNOSIS — F1120 Opioid dependence, uncomplicated: Secondary | ICD-10-CM | POA: Insufficient documentation

## 2021-03-20 DIAGNOSIS — G4700 Insomnia, unspecified: Secondary | ICD-10-CM | POA: Insufficient documentation

## 2021-03-20 DIAGNOSIS — F4312 Post-traumatic stress disorder, chronic: Secondary | ICD-10-CM

## 2021-03-20 DIAGNOSIS — E669 Obesity, unspecified: Secondary | ICD-10-CM | POA: Insufficient documentation

## 2021-03-20 DIAGNOSIS — H527 Unspecified disorder of refraction: Secondary | ICD-10-CM | POA: Insufficient documentation

## 2021-03-20 DIAGNOSIS — M25519 Pain in unspecified shoulder: Secondary | ICD-10-CM | POA: Insufficient documentation

## 2021-03-20 DIAGNOSIS — M25562 Pain in left knee: Secondary | ICD-10-CM | POA: Insufficient documentation

## 2021-03-20 DIAGNOSIS — M7702 Medial epicondylitis, left elbow: Secondary | ICD-10-CM | POA: Insufficient documentation

## 2021-03-20 DIAGNOSIS — M546 Pain in thoracic spine: Secondary | ICD-10-CM | POA: Insufficient documentation

## 2021-03-20 DIAGNOSIS — F1911 Other psychoactive substance abuse, in remission: Secondary | ICD-10-CM

## 2021-03-20 DIAGNOSIS — M67441 Ganglion, right hand: Secondary | ICD-10-CM | POA: Insufficient documentation
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
## 2021-03-20 NOTE — Patient Instructions (Signed)
https://www.nhlbi.nih.gov/files/docs/public/heart/dash_brief.pdf">  DASH Eating Plan DASH stands for Dietary Approaches to Stop Hypertension. The DASH eating plan is a healthy eating plan that has been shown to: Reduce high blood pressure (hypertension). Reduce your risk for type 2 diabetes, heart disease, and stroke. Help with weight loss. What are tips for following this plan? Reading food labels Check food labels for the amount of salt (sodium) per serving. Choose foods with less than 5 percent of the Daily Value of sodium. Generally, foods with less than 300 milligrams (mg) of sodium per serving fit into this eating plan. To find whole grains, look for the word "whole" as the first word in the ingredient list. Shopping Buy products labeled as "low-sodium" or "no salt added." Buy fresh foods. Avoid canned foods and pre-made or frozen meals. Cooking Avoid adding salt when cooking. Use salt-free seasonings or herbs instead of table salt or sea salt. Check with your health care provider or pharmacist before using salt substitutes. Do not fry foods. Cook foods using healthy methods such as baking, boiling, grilling, roasting, and broiling instead. Cook with heart-healthy oils, such as olive, canola, avocado, soybean, or sunflower oil. Meal planning  Eat a balanced diet that includes: 4 or more servings of fruits and 4 or more servings of vegetables each day. Try to fill one-half of your plate with fruits and vegetables. 6-8 servings of whole grains each day. Less than 6 oz (170 g) of lean meat, poultry, or fish each day. A 3-oz (85-g) serving of meat is about the same size as a deck of cards. One egg equals 1 oz (28 g). 2-3 servings of low-fat dairy each day. One serving is 1 cup (237 mL). 1 serving of nuts, seeds, or beans 5 times each week. 2-3 servings of heart-healthy fats. Healthy fats called omega-3 fatty acids are found in foods such as walnuts, flaxseeds, fortified milks, and eggs.  These fats are also found in cold-water fish, such as sardines, salmon, and mackerel. Limit how much you eat of: Canned or prepackaged foods. Food that is high in trans fat, such as some fried foods. Food that is high in saturated fat, such as fatty meat. Desserts and other sweets, sugary drinks, and other foods with added sugar. Full-fat dairy products. Do not salt foods before eating. Do not eat more than 4 egg yolks a week. Try to eat at least 2 vegetarian meals a week. Eat more home-cooked food and less restaurant, buffet, and fast food.  Lifestyle When eating at a restaurant, ask that your food be prepared with less salt or no salt, if possible. If you drink alcohol: Limit how much you use to: 0-1 drink a day for women who are not pregnant. 0-2 drinks a day for men. Be aware of how much alcohol is in your drink. In the [place] one drink equals one 12 oz bottle of beer (355 mL), one 5 oz glass of wine (148 mL), or one 1 oz glass of hard liquor (44 mL). General information Avoid eating more than 2,300 mg of salt a day. If you have hypertension, you may need to reduce your sodium intake to 1,500 mg a day. Work with your health care provider to maintain a healthy body weight or to lose weight. Ask what an ideal weight is for you. Get at least 30 minutes of exercise that causes your heart to beat faster (aerobic exercise) most days of the week. Activities may include walking, swimming, or biking. Work with your health care provider   or dietitian to adjust your eating plan to your individual calorie needs. What foods should I eat? Fruits All fresh, dried, or frozen fruit. Canned fruit in natural juice (without addedsugar). Vegetables Fresh or frozen vegetables (raw, steamed, roasted, or grilled). Low-sodium or reduced-sodium tomato and vegetable juice. Low-sodium or reduced-sodium tomatosauce and tomato paste. Low-sodium or reduced-sodium canned vegetables. Grains Whole-grain or  whole-wheat bread. Whole-grain or whole-wheat pasta. Brown rice. Oatmeal. Quinoa. Bulgur. Whole-grain and low-sodium cereals. Pita bread.Low-fat, low-sodium crackers. Whole-wheat flour tortillas. Meats and other proteins Skinless chicken or turkey. Ground chicken or turkey. Pork with fat trimmed off. Fish and seafood. Egg whites. Dried beans, peas, or lentils. Unsalted nuts, nut butters, and seeds. Unsalted canned beans. Lean cuts of beef with fat trimmed off. Low-sodium, lean precooked or cured meat, such as sausages or meatloaves. Dairy Low-fat (1%) or fat-free (skim) milk. Reduced-fat, low-fat, or fat-free cheeses. Nonfat, low-sodium ricotta or cottage cheese. Low-fat or nonfatyogurt. Low-fat, low-sodium cheese. Fats and oils Soft margarine without trans fats. Vegetable oil. Reduced-fat, low-fat, or light mayonnaise and salad dressings (reduced-sodium). Canola, safflower, olive, avocado, soybean, andsunflower oils. Avocado. Seasonings and condiments Herbs. Spices. Seasoning mixes without salt. Other foods Unsalted popcorn and pretzels. Fat-free sweets. The items listed above may not be a complete list of foods and beverages you can eat. Contact a dietitian for more information. What foods should I avoid? Fruits Canned fruit in a light or heavy syrup. Fried fruit. Fruit in cream or buttersauce. Vegetables Creamed or fried vegetables. Vegetables in a cheese sauce. Regular canned vegetables (not low-sodium or reduced-sodium). Regular canned tomato sauce and paste (not low-sodium or reduced-sodium). Regular tomato and vegetable juice(not low-sodium or reduced-sodium). Pickles. Olives. Grains Baked goods made with fat, such as croissants, muffins, or some breads. Drypasta or rice meal packs. Meats and other proteins Fatty cuts of meat. Ribs. Fried meat. Bacon. Bologna, salami, and other precooked or cured meats, such as sausages or meat loaves. Fat from the back of a pig (fatback). Bratwurst.  Salted nuts and seeds. Canned beans with added salt. Canned orsmoked fish. Whole eggs or egg yolks. Chicken or turkey with skin. Dairy Whole or 2% milk, cream, and half-and-half. Whole or full-fat cream cheese. Whole-fat or sweetened yogurt. Full-fat cheese. Nondairy creamers. Whippedtoppings. Processed cheese and cheese spreads. Fats and oils Butter. Stick margarine. Lard. Shortening. Ghee. Bacon fat. Tropical oils, suchas coconut, palm kernel, or palm oil. Seasonings and condiments Onion salt, garlic salt, seasoned salt, table salt, and sea salt. Worcestershire sauce. Tartar sauce. Barbecue sauce. Teriyaki sauce. Soy sauce, including reduced-sodium. Steak sauce. Canned and packaged gravies. Fish sauce. Oyster sauce. Cocktail sauce. Store-bought horseradish. Ketchup. Mustard. Meat flavorings and tenderizers. Bouillon cubes. Hot sauces. Pre-made or packaged marinades. Pre-made or packaged taco seasonings. Relishes. Regular saladdressings. Other foods Salted popcorn and pretzels. The items listed above may not be a complete list of foods and beverages you should avoid. Contact a dietitian for more information. Where to find more information National Heart, Lung, and Blood Institute: www.nhlbi.nih.gov American Heart Association: www.heart.org Academy of Nutrition and Dietetics: www.eatright.org National Kidney Foundation: www.kidney.org Summary The DASH eating plan is a healthy eating plan that has been shown to reduce high blood pressure (hypertension). It may also reduce your risk for type 2 diabetes, heart disease, and stroke. When on the DASH eating plan, aim to eat more fresh fruits and vegetables, whole grains, lean proteins, low-fat dairy, and heart-healthy fats. With the DASH eating plan, you should limit salt (sodium) intake to 2,300   mg a day. If you have hypertension, you may need to reduce your sodium intake to 1,500 mg a day. Work with your health care provider or dietitian to adjust  your eating plan to your individual calorie needs. This information is not intended to replace advice given to you by your health care provider. Make sure you discuss any questions you have with your healthcare provider. Document Revised: 07/02/2019 Document Reviewed: 07/02/2019 Elsevier Patient Education  2022 Elsevier Inc.  

## 2021-04-05 ENCOUNTER — Other Ambulatory Visit: Payer: Self-pay | Admitting: Neurosurgery

## 2021-04-05 DIAGNOSIS — M50123 Cervical disc disorder at C6-C7 level with radiculopathy: Secondary | ICD-10-CM

## 2021-04-11 ENCOUNTER — Ambulatory Visit
Admission: RE | Admit: 2021-04-11 | Discharge: 2021-04-11 | Disposition: A | Discharge status: Home / Self Care | Payer: No Typology Code available for payment source | Source: Ambulatory Visit | Attending: Neurosurgery | Admitting: Neurosurgery

## 2021-04-11 ENCOUNTER — Other Ambulatory Visit: Payer: No Typology Code available for payment source

## 2021-04-11 ENCOUNTER — Other Ambulatory Visit: Payer: Self-pay

## 2021-04-11 DIAGNOSIS — M50123 Cervical disc disorder at C6-C7 level with radiculopathy: Secondary | ICD-10-CM

## 2021-04-11 MED ORDER — IOPAMIDOL (ISOVUE-M 300) INJECTION 61%
1.0000 mL | Freq: Once | INTRAMUSCULAR | Status: AC | PRN
  Administered 2021-04-11: 1 mL via EPIDURAL

## 2021-04-11 MED ORDER — TRIAMCINOLONE ACETONIDE 40 MG/ML IJ SUSP (RADIOLOGY)
60.0000 mg | Freq: Once | INTRAMUSCULAR | Status: AC
  Administered 2021-04-11: 60 mg via EPIDURAL

## 2021-04-11 NOTE — Discharge Instructions (Signed)

## 2021-05-15 ENCOUNTER — Telehealth (HOSPITAL_BASED_OUTPATIENT_CLINIC_OR_DEPARTMENT_OTHER): Payer: Self-pay | Admitting: Physician Assistant

## 2021-05-15 NOTE — Telephone Encounter (Signed)
Patient's wife dropped off [employer] paperwork to be filled out.  Paperwork was placed in the CMA's in basket for [employer].  Please call Marcelino Duster (patient's wife) when ready at [phone].

## 2021-05-18 NOTE — Telephone Encounter (Signed)
Spoke with patient's wife, Marcelino Duster, and she gave verbal understanding of needing an appointment. Patient is already scheduled for CPE with [facility] in November.

## 2021-05-22 ENCOUNTER — Other Ambulatory Visit: Payer: Self-pay | Admitting: Neurosurgery

## 2021-05-22 DIAGNOSIS — M50123 Cervical disc disorder at C6-C7 level with radiculopathy: Secondary | ICD-10-CM

## 2021-06-04 ENCOUNTER — Ambulatory Visit
Admission: RE | Admit: 2021-06-04 | Discharge: 2021-06-04 | Disposition: A | Discharge status: Home / Self Care | Payer: No Typology Code available for payment source | Source: Ambulatory Visit | Attending: Neurosurgery | Admitting: Neurosurgery

## 2021-06-04 ENCOUNTER — Other Ambulatory Visit: Payer: Self-pay

## 2021-06-04 DIAGNOSIS — M50123 Cervical disc disorder at C6-C7 level with radiculopathy: Secondary | ICD-10-CM

## 2021-06-04 MED ORDER — IOPAMIDOL (ISOVUE-M 300) INJECTION 61%
1.0000 mL | Freq: Once | INTRAMUSCULAR | Status: AC
  Administered 2021-06-04: 1 mL via EPIDURAL

## 2021-06-04 MED ORDER — TRIAMCINOLONE ACETONIDE 40 MG/ML IJ SUSP (RADIOLOGY)
60.0000 mg | Freq: Once | INTRAMUSCULAR | Status: AC
  Administered 2021-06-04: 60 mg via EPIDURAL

## 2021-06-04 NOTE — Discharge Instructions (Signed)

## 2021-06-12 ENCOUNTER — Other Ambulatory Visit: Payer: Self-pay

## 2021-06-12 DIAGNOSIS — E785 Hyperlipidemia, unspecified: Secondary | ICD-10-CM

## 2021-06-12 DIAGNOSIS — Z0000 Encounter for general adult medical examination without abnormal findings: Secondary | ICD-10-CM

## 2021-06-13 ENCOUNTER — Other Ambulatory Visit: Payer: No Typology Code available for payment source

## 2021-06-14 ENCOUNTER — Other Ambulatory Visit: Payer: No Typology Code available for payment source

## 2021-06-20 ENCOUNTER — Encounter: Payer: No Typology Code available for payment source | Admitting: Physician Assistant

## 2021-08-15 ENCOUNTER — Other Ambulatory Visit: Payer: Self-pay | Admitting: Neurosurgery

## 2021-08-15 DIAGNOSIS — M50122 Cervical disc disorder at C5-C6 level with radiculopathy: Secondary | ICD-10-CM

## 2021-08-22 ENCOUNTER — Ambulatory Visit
Admission: RE | Admit: 2021-08-22 | Discharge: 2021-08-22 | Disposition: A | Discharge status: Home / Self Care | Payer: No Typology Code available for payment source | Source: Ambulatory Visit | Attending: Neurosurgery | Admitting: Neurosurgery

## 2021-08-22 DIAGNOSIS — M50122 Cervical disc disorder at C5-C6 level with radiculopathy: Secondary | ICD-10-CM

## 2021-08-22 MED ORDER — TRIAMCINOLONE ACETONIDE 40 MG/ML IJ SUSP (RADIOLOGY)
60.0000 mg | Freq: Once | INTRAMUSCULAR | Status: AC
  Administered 2021-08-22: 60 mg via EPIDURAL

## 2021-08-22 MED ORDER — IOPAMIDOL (ISOVUE-M 300) INJECTION 61%
1.0000 mL | Freq: Once | INTRAMUSCULAR | Status: AC | PRN
  Administered 2021-08-22: 1 mL via EPIDURAL

## 2021-08-22 NOTE — Discharge Instructions (Signed)

## 2021-08-29 ENCOUNTER — Other Ambulatory Visit: Payer: No Typology Code available for payment source

## 2021-08-30 ENCOUNTER — Other Ambulatory Visit: Payer: Self-pay

## 2021-08-30 DIAGNOSIS — Z1329 Encounter for screening for other suspected endocrine disorder: Secondary | ICD-10-CM

## 2021-08-30 DIAGNOSIS — Z0000 Encounter for general adult medical examination without abnormal findings: Secondary | ICD-10-CM

## 2021-08-30 DIAGNOSIS — E785 Hyperlipidemia, unspecified: Secondary | ICD-10-CM

## 2021-08-30 DIAGNOSIS — Z130 Encounter for screening for diseases of the blood and blood-forming organs and certain disorders involving the immune mechanism: Secondary | ICD-10-CM

## 2021-08-31 ENCOUNTER — Other Ambulatory Visit: Payer: No Typology Code available for payment source

## 2021-09-06 ENCOUNTER — Encounter: Payer: Self-pay | Admitting: Physician Assistant

## 2021-09-06 ENCOUNTER — Other Ambulatory Visit: Payer: Self-pay

## 2021-09-06 ENCOUNTER — Ambulatory Visit (INDEPENDENT_AMBULATORY_CARE_PROVIDER_SITE_OTHER): Payer: No Typology Code available for payment source | Admitting: Physician Assistant

## 2021-09-06 VITALS — BP 122/71 | HR 58 | Temp 96.0°F | Ht 69.0 in | Wt 212.0 lb

## 2021-09-06 DIAGNOSIS — M5010 Cervical disc disorder with radiculopathy, unspecified cervical region: Secondary | ICD-10-CM

## 2021-09-06 DIAGNOSIS — Z23 Encounter for immunization: Secondary | ICD-10-CM | POA: Diagnosis not present

## 2021-09-06 DIAGNOSIS — Z0000 Encounter for general adult medical examination without abnormal findings: Secondary | ICD-10-CM | POA: Diagnosis not present

## 2021-09-06 DIAGNOSIS — E785 Hyperlipidemia, unspecified: Secondary | ICD-10-CM | POA: Diagnosis not present

## 2021-09-06 MED ORDER — CYCLOBENZAPRINE HCL 10 MG PO TABS: 10.0000 mg | ORAL_TABLET | Freq: Three times a day (TID) | ORAL | 0 refills | Status: DC | PRN

## 2021-09-06 MED ORDER — ATORVASTATIN CALCIUM 20 MG PO TABS: 20.0000 mg | ORAL_TABLET | Freq: Every day | ORAL | 1 refills | Status: DC

## 2021-09-06 NOTE — Patient Instructions (Signed)

## 2021-09-06 NOTE — Progress Notes (Signed)
Complete physical exam   Patient: Curtis Christensen   DOB: 01-31-1976   46 y.o. Male  MRN: [id number] Visit Date: 09/06/2021   Chief Complaint  Patient presents with   Annual Exam   Subjective    Curtis Christensen is a 46 y.o. male who presents today for a complete physical exam.  He reports consuming a general diet. The patient does not participate in regular exercise at present. He generally feels fairly well. He does not have additional problems to discuss today.    Past Medical History:  Diagnosis Date   Anxiety    Depression    PTSD (post-traumatic stress disorder)    History reviewed. No pertinent surgical history. Social History   Socioeconomic History   Marital status: Married    Spouse name: Not on file   Number of children: Not on file   Years of education: Not on file   Highest education level: Not on file  Occupational History   Not on file  Tobacco Use   Smoking status: Never   Smokeless tobacco: Never  Substance and Sexual Activity   Alcohol use: No   Drug use: No   Sexual activity: Not on file  Other Topics Concern   Not on file  Social History Narrative   Not on file   Social Determinants of Health   Financial Resource Strain: Not on file  Food Insecurity: Not on file  Transportation Needs: Not on file  Physical Activity: Not on file  Stress: Not on file  Social Connections: Not on file  Intimate Partner Violence: Not on file     Medications: Outpatient Medications Prior to Visit  Medication Sig   FLUoxetine (PROZAC) 20 MG capsule TAKE TWO CAPSULES BY MOUTH ONCE A DAY FOR MENTAL HEALTH   propranolol (INDERAL) 10 MG tablet Take 1 tablet by mouth every 6 (six) hours as needed.   albuterol (VENTOLIN HFA) 108 (90 Base) MCG/ACT inhaler INHALE 1 PUFF BY MOUTH FOUR TIMES A DAY AS NEEDED   GABAPENTIN PO Take 300 mg by mouth 3 (three) times daily.   ibuprofen (ADVIL,MOTRIN) 800 MG tablet Take 1 tablet (800 mg total) by mouth every 8 (eight) hours as  needed for mild pain or moderate pain.   omeprazole (PRILOSEC) 10 MG capsule Take 20 mg by mouth daily.   TRAZODONE HCL PO Take 100 mg by mouth at bedtime.   [DISCONTINUED] acetaminophen-codeine (TYLENOL #3) 300-30 MG tablet Take 1-2 tablets by mouth every 6 (six) hours as needed for moderate pain.   [DISCONTINUED] atorvastatin (LIPITOR) 20 MG tablet Take 20 mg by mouth daily.   [DISCONTINUED] cyclobenzaprine (FLEXERIL) 10 MG tablet Take 10 mg by mouth 3 (three) times daily as needed for muscle spasms.   No facility-administered medications prior to visit.    Review of Systems Review of Systems:  A fourteen system review of systems was performed and found to be positive as per HPI.    Objective    BP 122/71    Pulse (!) 58    Temp (!) 96 F (35.6 C)    Ht 5\' 9"  (1.753 m)    Wt 212 lb (96.2 kg)    SpO2 96%    BMI 31.31 kg/m    Physical Exam    General Appearance:    Well developed, well nourished male. Alert, cooperative, in no acute distress, appears stated age  Head:    Normocephalic, without obvious abnormality, atraumatic  Eyes:    PERRL, conjunctiva/corneas clear, EOM's  intact, fundi    benign, both eyes       Ears:    Normal TM's and external ear canals, both ears  Nose:   Nares normal, septum midline, mucosa normal, no drainage   or sinus tenderness  Throat:   Lips, mucosa, and tongue normal; teeth and gums normal  Neck:   Supple, symmetrical, trachea midline, Mild cervical adenopathy (submandibular gland); thyroid:  No enlargement/tenderness/nodules; no JVD  Back:     Symmetric, no curvature, ROM normal, no CVA tenderness  Lungs:     Clear to auscultation bilaterally, respirations unlabored  Chest wall:    No tenderness or deformity  Heart:    Bradycardic. Normal rhythm. No murmurs, rubs, or gallops.  S1 and S2 normal  Abdomen:     Soft, non-tender, bowel sounds active all four quadrants,    no masses, no organomegaly  Genitalia:    deferred  Rectal:    deferred   Extremities:   All extremities are intact. No cyanosis or edema  Pulses:   2+ and symmetric all extremities  Skin:   Skin color, texture, turgor normal, no rashes or lesions  Lymph nodes:   Cervical, supraclavicular, and axillary nodes normal  Neurologic:   CNII-XII grossly intact    Last depression screening scores PHQ 2/9 Scores 09/06/2021 03/20/2021  PHQ - 2 Score 0 0  PHQ- 9 Score 3 0   Last fall risk screening Fall Risk  09/06/2021  Falls in the past year? 0  Number falls in past yr: 0  Injury with Fall? 0  Risk for fall due to : No Fall Risks  Follow up Falls evaluation completed     No results found for any visits on 09/06/21.  Assessment & Plan    Routine Health Maintenance and Physical Exam  Exercise Activities and Dietary recommendations -Discussed heart healthy diet low in fat and carbohydrates. Recommend to stay as active as possible.  Immunization History  Administered Date(s) Administered   H1N1 01/02/2009   Influenza,inj,Quad PF,6+ Mos 05/31/2016, 10/24/2017, 05/19/2020   Influenza-Unspecified 07/18/2007, 05/06/2008, 08/21/2009, 07/31/2010, 08/22/2011, 05/18/2012, 05/17/2013, 07/29/2014   Pneumococcal Polysaccharide-23 04/25/2016   Tdap 10/24/2015, 03/27/2017    Health Maintenance  Topic Date Due   COVID-19 Vaccine (1) Never done   HIV Screening  Never done   Hepatitis C Screening  Never done   INFLUENZA VACCINE  03/12/2021   TETANUS/TDAP  03/28/2027   HPV VACCINES  Aged Out    Discussed health benefits of physical activity, and encouraged him to engage in regular exercise appropriate for his age and condition.  Problem List Items Addressed This Visit       Musculoskeletal and Integument   Cervical disc disorder with radiculopathy   Relevant Medications   cyclobenzaprine (FLEXERIL) 10 MG tablet     Other   Hyperlipidemia   Relevant Medications   propranolol (INDERAL) 10 MG tablet   atorvastatin (LIPITOR) 20 MG tablet   Other Visit  Diagnoses     Need for influenza vaccination    -  Primary   Relevant Orders   Flu Vaccine QUAD 32mo+IM (Fluarix, Fluzone & Alfiuria Quad PF)   Healthcare maintenance          Advised to schedule lab visit for fasting blood work Architectural technologist. Continue to follow up with neurosurgery and BH. Patient agreeable to influenza vaccine. Pt declined Hep C and HIV screenings.   Return in about 6 months (around 03/06/2022) for Mood, insomnia, HLD; lab visit for FBW  tomorrow .       Lorrene Reid, PA-C  [facility] at [facility] [phone] (phone) [phone] (fax)  [place]

## 2021-09-07 ENCOUNTER — Other Ambulatory Visit: Payer: No Typology Code available for payment source

## 2021-09-07 DIAGNOSIS — E785 Hyperlipidemia, unspecified: Secondary | ICD-10-CM

## 2021-09-07 DIAGNOSIS — Z13228 Encounter for screening for other metabolic disorders: Secondary | ICD-10-CM

## 2021-09-07 DIAGNOSIS — Z0000 Encounter for general adult medical examination without abnormal findings: Secondary | ICD-10-CM

## 2021-09-07 DIAGNOSIS — Z130 Encounter for screening for diseases of the blood and blood-forming organs and certain disorders involving the immune mechanism: Secondary | ICD-10-CM

## 2021-09-08 LAB — LIPID PANEL
Chol/HDL Ratio: 7.1 ratio — ABNORMAL HIGH (ref 0.0–5.0)
Cholesterol, Total: 255 mg/dL — ABNORMAL HIGH (ref 100–199)
HDL: 36 mg/dL — ABNORMAL LOW (ref >39)
LDL Chol Calc (NIH): 194 mg/dL — ABNORMAL HIGH (ref 0–99)
Triglycerides: 137 mg/dL (ref 0–149)
VLDL Cholesterol Cal: 25 mg/dL (ref 5–40)

## 2021-09-08 LAB — CBC WITH DIFFERENTIAL/PLATELET
Basophils Absolute: 0.1 10*3/uL (ref 0.0–0.2)
Basos: 1 %
EOS (ABSOLUTE): 0.2 10*3/uL (ref 0.0–0.4)
Eos: 3 %
Hematocrit: 43.8 % (ref 37.5–51.0)
Hemoglobin: 15.4 g/dL (ref 13.0–17.7)
Immature Grans (Abs): 0 10*3/uL (ref 0.0–0.1)
Immature Granulocytes: 0 %
Lymphocytes Absolute: 1.7 10*3/uL (ref 0.7–3.1)
Lymphs: 20 %
MCH: 31.6 pg (ref 26.6–33.0)
MCHC: 35.2 g/dL (ref 31.5–35.7)
MCV: 90 fL (ref 79–97)
Monocytes Absolute: 0.7 10*3/uL (ref 0.1–0.9)
Monocytes: 8 %
Neutrophils Absolute: 6.1 10*3/uL (ref 1.4–7.0)
Neutrophils: 68 %
Platelets: 199 10*3/uL (ref 150–450)
RBC: 4.87 x10E6/uL (ref 4.14–5.80)
RDW: 12 % (ref 11.6–15.4)
WBC: 8.7 10*3/uL (ref 3.4–10.8)

## 2021-09-08 LAB — COMPREHENSIVE METABOLIC PANEL
ALT: 13 IU/L (ref 0–44)
AST: 15 IU/L (ref 0–40)
Albumin/Globulin Ratio: 2.1 (ref 1.2–2.2)
Albumin: 4.4 g/dL (ref 4.0–5.0)
Alkaline Phosphatase: 78 IU/L (ref 44–121)
BUN/Creatinine Ratio: 12 (ref 9–20)
BUN: 14 mg/dL (ref 6–24)
Bilirubin Total: 0.4 mg/dL (ref 0.0–1.2)
CO2: 24 mmol/L (ref 20–29)
Calcium: 9 mg/dL (ref 8.7–10.2)
Chloride: 102 mmol/L (ref 96–106)
Creatinine, Ser: 1.15 mg/dL (ref 0.76–1.27)
Globulin, Total: 2.1 g/dL (ref 1.5–4.5)
Glucose: 89 mg/dL (ref 70–99)
Potassium: 4.5 mmol/L (ref 3.5–5.2)
Sodium: 138 mmol/L (ref 134–144)
Total Protein: 6.5 g/dL (ref 6.0–8.5)
eGFR: 80 mL/min/{1.73_m2} (ref >59)

## 2021-09-08 LAB — HEMOGLOBIN A1C
Est. average glucose Bld gHb Est-mCnc: 123 mg/dL
Hgb A1c MFr Bld: 5.9 % — ABNORMAL HIGH (ref 4.8–5.6)

## 2021-09-08 LAB — TSH: TSH: 0.616 u[IU]/mL (ref 0.450–4.500)

## 2021-09-14 ENCOUNTER — Other Ambulatory Visit: Payer: Self-pay

## 2021-09-14 DIAGNOSIS — G4733 Obstructive sleep apnea (adult) (pediatric): Secondary | ICD-10-CM

## 2021-09-14 DIAGNOSIS — Z77098 Contact with and (suspected) exposure to other hazardous, chiefly nonmedicinal, chemicals: Secondary | ICD-10-CM

## 2021-09-14 DIAGNOSIS — R0989 Other specified symptoms and signs involving the circulatory and respiratory systems: Secondary | ICD-10-CM

## 2021-11-08 ENCOUNTER — Telehealth: Payer: Self-pay | Admitting: Physician Assistant

## 2021-11-08 NOTE — Telephone Encounter (Signed)
Patients wife called and said that patient is [facility] and you are his pcp you have to refer him to a dentist and opthalmology. She said it was due to his insurance. I asked if they had a preference of a dentist or optometrist and she said no that whomever you wanted to send him to would be fine. Please advise. [phone]  ?

## 2021-11-09 ENCOUNTER — Other Ambulatory Visit: Payer: Self-pay | Admitting: Oncology

## 2021-11-09 ENCOUNTER — Other Ambulatory Visit: Payer: Self-pay

## 2021-11-09 DIAGNOSIS — Z0100 Encounter for examination of eyes and vision without abnormal findings: Secondary | ICD-10-CM

## 2021-11-09 DIAGNOSIS — Z0120 Encounter for dental examination and cleaning without abnormal findings: Secondary | ICD-10-CM

## 2021-11-09 NOTE — Telephone Encounter (Signed)
Referrals placed 

## 2022-01-01 ENCOUNTER — Telehealth: Payer: Self-pay | Admitting: Physician Assistant

## 2022-01-01 NOTE — Telephone Encounter (Signed)
This patient had 2 referrals placed while he was in office however they have to go through [employer] [facility]) before they will approve the referrals. Can you please do this for him?

## 2022-01-15 NOTE — Telephone Encounter (Signed)
Community care form completed and faxed to [facility]. AS, CMA

## 2022-01-22 ENCOUNTER — Other Ambulatory Visit: Payer: Self-pay | Admitting: Neurosurgery

## 2022-01-22 DIAGNOSIS — M542 Cervicalgia: Secondary | ICD-10-CM

## 2022-02-13 ENCOUNTER — Telehealth: Payer: Self-pay | Admitting: Physician Assistant

## 2022-02-13 NOTE — Telephone Encounter (Signed)
Patient's wife called back with fax number [phone]

## 2022-02-13 NOTE — Telephone Encounter (Signed)
I had sent a message back in May regarding this patient and two referrals and you had responded that you had faxed it over however the [place] still has not gotten it and now it's more urgent. Can you please follow up on this?

## 2022-02-14 NOTE — Telephone Encounter (Signed)
This has been submitted to the [place] back in May. I will be glad to resubmit this. AS, CMA

## 2022-02-18 ENCOUNTER — Ambulatory Visit
Admission: RE | Admit: 2022-02-18 | Discharge: 2022-02-18 | Disposition: A | Discharge status: Home / Self Care | Payer: No Typology Code available for payment source | Source: Ambulatory Visit | Attending: Neurosurgery | Admitting: Neurosurgery

## 2022-02-18 DIAGNOSIS — M542 Cervicalgia: Secondary | ICD-10-CM

## 2022-04-09 ENCOUNTER — Other Ambulatory Visit: Payer: Self-pay | Admitting: Neurosurgery

## 2022-04-10 ENCOUNTER — Other Ambulatory Visit: Payer: Self-pay | Admitting: Neurosurgery

## 2022-04-22 ENCOUNTER — Telehealth: Payer: Self-pay | Admitting: Physician Assistant

## 2022-04-22 NOTE — Telephone Encounter (Signed)
Patient's wife called into office to see if his paperwork was ready to be picked up.  Paperwork was left the week of August 28th and they never got a call stating it was ready.  Patient is having surgery on Tuesday, September 19th and needs this paperwork completed to be turned in.  I made patient aware Kandis Cocking was gone for two weeks and would return on Monday, September 18th.  Patient's wife was very upset because she was told this would be completed.  I confirmed the paperwork is sitting in Maritza's in basket and would be filled out once she returned.  Please call patient once completed.

## 2022-04-24 NOTE — Progress Notes (Signed)
Surgical Instructions    Your procedure is scheduled on Tuesday, 04/30/22.  Report to [facility] Main Entrance "A" at 10:30 A.M., then check in with the Admitting office.  Call this number if you have problems the morning of surgery:  [phone]   If you have any questions prior to your surgery date call [phone]: Open Monday-Friday 8am-4pm    Remember:  Do not eat or drink after midnight the night before your surgery     Take these medicines the morning of surgery with A SIP OF WATER:  atorvastatin (LIPITOR) FLUoxetine (PROZAC) GABAPENTIN omeprazole (PRILOSEC)   IF NEEDED: albuterol (VENTOLIN HFA) 108 (90 Base) MCG/ACT inhaler- bring with you the day of surgery cyclobenzaprine (FLEXERIL)  propranolol (INDERAL)   As of today, STOP taking any Aspirin (unless otherwise instructed by your surgeon) Aleve, Naproxen, Ibuprofen, Motrin, Advil, Goody's, BC's, all herbal medications, fish oil, and all vitamins.           Do not wear jewelry or makeup. Do not wear lotions, powders, perfumes/cologne or deodorant. Men may shave face and neck. Do not bring valuables to the hospital. Do not wear nail polish, gel polish, artificial nails, or any other type of covering on natural nails (fingers and toes) If you have artificial nails or gel coating that need to be removed by a nail salon, please have this removed prior to surgery. Artificial nails or gel coating may interfere with anesthesia's ability to adequately monitor your vital signs.  [facility] is not responsible for any belongings or valuables.    Do NOT Smoke (Tobacco/Vaping)  24 hours prior to your procedure.  If you use a CPAP at night, you may bring your mask for your overnight stay.   Contacts, glasses, hearing aids, dentures or partials may not be worn into surgery, please bring cases for these belongings   For patients admitted to the hospital, discharge time will be determined by your treatment team.   Patients  discharged the day of surgery will not be allowed to drive home, and someone needs to stay with them for 24 hours.   SURGICAL WAITING ROOM VISITATION Patients having surgery or a procedure may have no more than 2 support people in the waiting area - these visitors may rotate.   Children under the age of 47 must have an adult with them who is not the patient. If the patient needs to stay at the hospital during part of their recovery, the visitor guidelines for inpatient rooms apply. Pre-op nurse will coordinate an appropriate time for 1 support person to accompany patient in pre-op.  This support person may not rotate.   Please refer to the [facility] website for the visitor guidelines for Inpatients (after your surgery is over and you are in a regular room).    Special instructions:    Oral Hygiene is also important to reduce your risk of infection.  Remember - BRUSH YOUR TEETH THE MORNING OF SURGERY WITH YOUR REGULAR TOOTHPASTE   [place]- Preparing For Surgery  Before surgery, you can play an important role. Because skin is not sterile, your skin needs to be as free of germs as possible. You can reduce the number of germs on your skin by washing with CHG (chlorahexidine gluconate) Soap before surgery.  CHG is an antiseptic cleaner which kills germs and bonds with the skin to continue killing germs even after washing.     Please do not use if you have an allergy to CHG or antibacterial  soaps. If your skin becomes reddened/irritated stop using the CHG.  Do not shave (including legs and underarms) for at least 48 hours prior to first CHG shower. It is OK to shave your face.  Please follow these instructions carefully.     Shower the NIGHT BEFORE SURGERY and the MORNING OF SURGERY with CHG Soap.   If you chose to wash your hair, wash your hair first as usual with your normal shampoo. After you shampoo, rinse your hair and body thoroughly to remove the shampoo.  Then [employer] and  genitals (private parts) with your normal soap and rinse thoroughly to remove soap.  After that Use CHG Soap as you would any other liquid soap. You can apply CHG directly to the skin and wash gently with a scrungie or a clean washcloth.   Apply the CHG Soap to your body ONLY FROM THE NECK DOWN.  Do not use on open wounds or open sores. Avoid contact with your eyes, ears, mouth and genitals (private parts). Wash Face and genitals (private parts)  with your normal soap.   Wash thoroughly, paying special attention to the area where your surgery will be performed.  Thoroughly rinse your body with warm water from the neck down.  DO NOT shower/wash with your normal soap after using and rinsing off the CHG Soap.  Pat yourself dry with a CLEAN TOWEL.  Wear CLEAN PAJAMAS to bed the night before surgery  Place CLEAN SHEETS on your bed the night before your surgery  DO NOT SLEEP WITH PETS.   Day of Surgery: Take a shower with CHG soap. Wear Clean/Comfortable clothing the morning of surgery Do not apply any deodorants/lotions.   Remember to brush your teeth WITH YOUR REGULAR TOOTHPASTE.    If you received a COVID test during your pre-op visit, it is requested that you wear a mask when out in public, stay away from anyone that may not be feeling well, and notify your surgeon if you develop symptoms. If you have been in contact with anyone that has tested positive in the last 10 days, please notify your surgeon.    Please read over the following fact sheets that you were given.

## 2022-04-25 ENCOUNTER — Other Ambulatory Visit: Payer: Self-pay

## 2022-04-25 ENCOUNTER — Encounter (HOSPITAL_COMMUNITY)
Admission: RE | Admit: 2022-04-25 | Discharge: 2022-04-25 | Disposition: A | Discharge status: Home / Self Care | Payer: No Typology Code available for payment source | Source: Ambulatory Visit | Attending: Neurosurgery | Admitting: Neurosurgery

## 2022-04-25 ENCOUNTER — Encounter (HOSPITAL_COMMUNITY): Payer: Self-pay

## 2022-04-25 VITALS — BP 104/75 | HR 58 | Temp 98.0°F | Resp 16 | Ht 67.0 in | Wt 211.0 lb

## 2022-04-25 DIAGNOSIS — Z01812 Encounter for preprocedural laboratory examination: Secondary | ICD-10-CM | POA: Insufficient documentation

## 2022-04-25 DIAGNOSIS — M5412 Radiculopathy, cervical region: Secondary | ICD-10-CM | POA: Insufficient documentation

## 2022-04-25 DIAGNOSIS — M4802 Spinal stenosis, cervical region: Secondary | ICD-10-CM | POA: Insufficient documentation

## 2022-04-25 DIAGNOSIS — Z01818 Encounter for other preprocedural examination: Secondary | ICD-10-CM

## 2022-04-25 HISTORY — DX: Personal history of unintended awareness under general anesthesia: Z92.84

## 2022-04-25 HISTORY — DX: Prediabetes: R73.03

## 2022-04-25 HISTORY — DX: Gastro-esophageal reflux disease without esophagitis: K21.9

## 2022-04-25 HISTORY — DX: Pneumonia, unspecified organism: J18.9

## 2022-04-25 HISTORY — DX: Unspecified asthma, uncomplicated: J45.909

## 2022-04-25 HISTORY — DX: Other complications of anesthesia, initial encounter: T88.59XA

## 2022-04-25 HISTORY — DX: Sleep apnea, unspecified: G47.30

## 2022-04-25 HISTORY — DX: Headache, unspecified: R51.9

## 2022-04-25 LAB — TYPE AND SCREEN
ABO/RH(D): B NEG
Antibody Screen: NEGATIVE

## 2022-04-25 LAB — BASIC METABOLIC PANEL
Anion gap: 6 (ref 5–15)
BUN: 11 mg/dL (ref 6–20)
CO2: 25 mmol/L (ref 22–32)
Calcium: 9 mg/dL (ref 8.9–10.3)
Chloride: 108 mmol/L (ref 98–111)
Creatinine, Ser: 1.14 mg/dL (ref 0.61–1.24)
GFR, Estimated: >60 mL/min (ref >60)
Glucose, Bld: 102 mg/dL — ABNORMAL HIGH (ref 70–99)
Potassium: 4.1 mmol/L (ref 3.5–5.1)
Sodium: 139 mmol/L (ref 135–145)

## 2022-04-25 LAB — CBC
HCT: 43.1 % (ref 39.0–52.0)
Hemoglobin: 14.6 g/dL (ref 13.0–17.0)
MCH: 31.9 pg (ref 26.0–34.0)
MCHC: 33.9 g/dL (ref 30.0–36.0)
MCV: 94.3 fL (ref 80.0–100.0)
Platelets: 196 10*3/uL (ref 150–400)
RBC: 4.57 MIL/uL (ref 4.22–5.81)
RDW: 12.7 % (ref 11.5–15.5)
WBC: 9.4 10*3/uL (ref 4.0–10.5)
nRBC: 0 % (ref 0.0–0.2)

## 2022-04-25 LAB — SURGICAL PCR SCREEN
MRSA, PCR: NEGATIVE
Staphylococcus aureus: NEGATIVE

## 2022-04-25 NOTE — Progress Notes (Addendum)
PCP - Mayer Masker [facility]ologist [facility]  PPM/ICD - denies   Chest x-ray -n/a  EKG - n/a [facility] Test [facility] Cardiac Cath - denies  Sleep Study - +OSA CPAP - does not wear nightly  No diabetes  As of today, STOP taking any Aspirin (unless otherwise instructed by your surgeon) Aleve, Naproxen, Ibuprofen, Motrin, Advil, Goody's, BC's, all herbal medications, fish oil, and all vitamins.  ERAS Protcol -no   COVID TEST- no   Anesthesia review: yes, records requested for stress test and echo from [place] as well as anesthesia records from [facility] surgical center (nundkumar surgery- anesthesia records). Pt states that he metabolizes anesthesia quickly and started to wake up during his surgery with Curtis Christensen last year (November '21)  Patient denies shortness of breath, fever, cough and chest pain at PAT appointment   All instructions explained to the patient, with a verbal understanding of the material. Patient agrees to go over the instructions while at home for a better understanding. Patient also instructed to self quarantine after being tested for COVID-19. The opportunity to ask questions was provided.

## 2022-04-26 ENCOUNTER — Encounter (HOSPITAL_COMMUNITY): Payer: Self-pay

## 2022-04-26 NOTE — Progress Notes (Signed)
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  History includes never smoker, PTSD, asthma, OSA (does not use CPAP), prediabetes, GERD, TBI 2004, spinal surgery (C4-7 ACDF ~ 06/2020), umbilical hernia repair (2016), right carpal tunnel release (08/03/18).  BMI is consistent with obesity.  He did report daily marijuana use.  No current alcohol use is documented.  For anesthesia history, he reported accidental awareness under general anesthesia--per PAT RN documentation, "Pt states that he metabolizes anesthesia quickly and started to wake up during his surgery with Nundkumar last year (November '21)"--records requested from the [facility].  He had cardiac testing in July 2021 (see CV below) including low risk stress test, normal LVEF. He has known incomplete RBBB.   Awaiting old anesthesia records. Anesthesia team to evaluate on the day of surgery.    VS: BP 104/75   Pulse (!) 58   Temp 36.7 C (Oral)   Resp 16   Ht 5\' 7"  (1.702 m)   Wt 95.7 kg   SpO2 99%   BMI 33.05 kg/m    PROVIDERS: , PA-C is PCP    LABS: Labs reviewed: Acceptable for surgery. A1c 5.6% on 09/25/20 [facility]) (all labs ordered are listed, but only abnormal results are displayed)  Labs Reviewed  BASIC METABOLIC PANEL - Abnormal; Notable for the following components:      Result Value   Glucose, Bld 102 (*)    All other components within normal limits  SURGICAL PCR SCREEN  CBC  TYPE AND SCREEN     IMAGES: CT C-spine 02/18/22: IMPRESSION: - Previous ACDF C4  through C7. Apparent solid union throughout that region. Apparent good decompression of the central canal. - C2-3: Moderate left bony foraminal stenosis. - C3-4: Bilateral bony foraminal narrowing left worse than right. - C4-5: Mild bilateral bony foraminal narrowing. - C5-6: Moderate right foraminal narrowing and marked left foraminal narrowing. - C6-7: Moderate right foraminal narrowing and mild left foraminal narrowing.   EKG: 01/18/21: Sinus rhythm Incomplete right bundle branch block No significant change since last tracing Confirmed by 03/20/21 [phone]) on 01/18/2021 8:26:53 PM   CV: Nuclear stress test 03/08/2020 [facility] [place]): Incomplete fax received but results included:  - Myocardial perfusion imaging: There is a mild intensity fixed inferior defect consistent with attenuation artifact.  There is no reversible ischemia or infarct - Left ventricular ejection fraction is 58%.  No left ventricular regional wall motion abnormalities. - [facility] scoring: Exercise time of 10:36; 11.4 METS; no chest pain with exertion.  EKG not interpretable at peak stress due to artifact, no ST changes in recovery. - Low cardiovascular risk study.   Echocardiogram 03/08/2020 [facility] [place]): Incomplete fax received.  Fax starts at: - Right ventricle: The right ventricle is normal in size and function.  TAPSE is 3.0 cm. - Left atrium: Left atrial size is normal. - Right atrium: Right atrial size is normal. - Aortic valve: Aortic valve opens well.  The aortic valve is trileaflet.  No hemodynamically significant valvular aortic stenosis. - Pulmonic valve: The pulmonic  valve is grossly normal as visualized.  The pulmonic valve is not well visualized.  There is no pulmonic valve stenosis. - Mitral valve: The mitral valve is grossly normal.  There is trace mitral regurgitation. - Tricuspid valve: The tricuspid valve is grossly normal.  There is trace tricuspid regurgitation.  RVSP is estimated at 20  mmHg. - Great vessels: The aortic valve is normal size.  The proximal ascending aorta is not well visualized. (Per stress test that same day, LVEF 58%, no regional wall motion abnormalities.)   Past Medical History:  Diagnosis Date   Anxiety    Asthma    Complication of anesthesia    Depression    GERD (gastroesophageal reflux disease)    Headache    History of accidental awareness under general anesthesia    Pneumonia    Pre-diabetes    PTSD (post-traumatic stress disorder)    Sleep apnea    Traumatic brain injury (HCC) 2004    Past Surgical History:  Procedure Laterality Date   CERVICAL FUSION  2021   FASCIOTOMY Left    labral tear repair Right    right shoulder   UMBILICAL HERNIA REPAIR  2016    MEDICATIONS:  albuterol (VENTOLIN HFA) 108 (90 Base) MCG/ACT inhaler   atorvastatin (LIPITOR) 20 MG tablet   busPIRone (BUSPAR) 15 MG tablet   cyclobenzaprine (FLEXERIL) 10 MG tablet   FLUoxetine (PROZAC) 20 MG capsule   gabapentin (NEURONTIN) 300 MG capsule   ibuprofen (ADVIL,MOTRIN) 800 MG tablet   Omeprazole 20 MG TBDD   traZODone (DESYREL) 100 MG tablet   No current facility-administered medications for this encounter.    Shonna Chock, PA-C Surgical Short Stay/Anesthesiology [facility] Phone [phone] [facility] Phone [phone] 04/26/2022 5:31 PM

## 2022-04-29 NOTE — Anesthesia Preprocedure Evaluation (Addendum)
Anesthesia Evaluation Anesthesia Physical Anesthesia Plan  ASA:   Anesthesia Plan:    Post-op Pain Management:    Induction:   PONV Risk Score and Plan:   Airway Management Planned:   Additional Equipment:   Intra-op Plan:   Post-operative Plan:   Informed Consent:   Plan Discussed with:   Anesthesia Plan Comments: (PAT note written by Myra Gianotti, PA-C. See anesthesia history. Copy of 06/27/20 anesthesia records from [facility] are on his shadow chart.)        Anesthesia Quick Evaluation

## 2022-04-29 NOTE — Telephone Encounter (Signed)
lmtc

## 2022-04-30 ENCOUNTER — Encounter (HOSPITAL_COMMUNITY): Payer: Self-pay | Admitting: Neurosurgery

## 2022-04-30 ENCOUNTER — Other Ambulatory Visit: Payer: Self-pay

## 2022-04-30 ENCOUNTER — Inpatient Hospital Stay (HOSPITAL_COMMUNITY)
Admission: RE | Admit: 2022-04-30 | Discharge: 2022-05-01 | DRG: 473 | Disposition: A | Discharge status: Home / Self Care | Payer: No Typology Code available for payment source | Attending: Neurosurgery | Admitting: Neurosurgery

## 2022-04-30 ENCOUNTER — Inpatient Hospital Stay (HOSPITAL_COMMUNITY): Payer: No Typology Code available for payment source | Admitting: Anesthesiology

## 2022-04-30 ENCOUNTER — Encounter (HOSPITAL_COMMUNITY)
Admission: RE | Disposition: A | Discharge status: Home / Self Care | Payer: Self-pay | Source: Home / Self Care | Attending: Neurosurgery

## 2022-04-30 ENCOUNTER — Inpatient Hospital Stay (HOSPITAL_COMMUNITY): Payer: No Typology Code available for payment source

## 2022-04-30 ENCOUNTER — Inpatient Hospital Stay (HOSPITAL_COMMUNITY): Payer: No Typology Code available for payment source | Admitting: Physician Assistant

## 2022-04-30 DIAGNOSIS — J45909 Unspecified asthma, uncomplicated: Secondary | ICD-10-CM | POA: Diagnosis present

## 2022-04-30 DIAGNOSIS — G4730 Sleep apnea, unspecified: Secondary | ICD-10-CM | POA: Diagnosis present

## 2022-04-30 DIAGNOSIS — I209 Angina pectoris, unspecified: Secondary | ICD-10-CM | POA: Diagnosis present

## 2022-04-30 DIAGNOSIS — K219 Gastro-esophageal reflux disease without esophagitis: Secondary | ICD-10-CM | POA: Diagnosis present

## 2022-04-30 DIAGNOSIS — Z885 Allergy status to narcotic agent status: Secondary | ICD-10-CM | POA: Diagnosis not present

## 2022-04-30 DIAGNOSIS — F32A Depression, unspecified: Secondary | ICD-10-CM | POA: Diagnosis present

## 2022-04-30 DIAGNOSIS — Z79899 Other long term (current) drug therapy: Secondary | ICD-10-CM

## 2022-04-30 DIAGNOSIS — Z8701 Personal history of pneumonia (recurrent): Secondary | ICD-10-CM | POA: Diagnosis not present

## 2022-04-30 DIAGNOSIS — M4722 Other spondylosis with radiculopathy, cervical region: Secondary | ICD-10-CM | POA: Diagnosis present

## 2022-04-30 DIAGNOSIS — Z87820 Personal history of traumatic brain injury: Secondary | ICD-10-CM

## 2022-04-30 DIAGNOSIS — Z888 Allergy status to other drugs, medicaments and biological substances status: Secondary | ICD-10-CM

## 2022-04-30 DIAGNOSIS — F419 Anxiety disorder, unspecified: Secondary | ICD-10-CM | POA: Diagnosis present

## 2022-04-30 DIAGNOSIS — Z981 Arthrodesis status: Secondary | ICD-10-CM

## 2022-04-30 DIAGNOSIS — M4802 Spinal stenosis, cervical region: Secondary | ICD-10-CM

## 2022-04-30 DIAGNOSIS — M5412 Radiculopathy, cervical region: Secondary | ICD-10-CM

## 2022-04-30 HISTORY — PX: POSTERIOR CERVICAL FUSION/FORAMINOTOMY: SHX5038

## 2022-04-30 LAB — ABO/RH: ABO/RH(D): B NEG

## 2022-04-30 SURGERY — POSTERIOR CERVICAL FUSION/FORAMINOTOMY LEVEL 3
Anesthesia: General

## 2022-04-30 MED ORDER — ALBUTEROL SULFATE (2.5 MG/3ML) 0.083% IN NEBU: 2.5000 mg | INHALATION_SOLUTION | RESPIRATORY_TRACT | Status: DC | PRN

## 2022-04-30 MED ORDER — THROMBIN 5000 UNITS EX SOLR
CUTANEOUS | Status: AC
  Filled 2022-04-30: qty 5000

## 2022-04-30 MED ORDER — PANTOPRAZOLE SODIUM 40 MG PO TBEC
40.0000 mg | DELAYED_RELEASE_TABLET | Freq: Every day | ORAL | Status: DC
  Administered 2022-04-30: 40 mg via ORAL
  Filled 2022-04-30: qty 1

## 2022-04-30 MED ORDER — OMEPRAZOLE 20 MG PO TBDD: 20.0000 mg | DELAYED_RELEASE_TABLET | Freq: Every day | ORAL | Status: DC

## 2022-04-30 MED ORDER — METHOCARBAMOL 1000 MG/10ML IJ SOLN: 500.0000 mg | Freq: Four times a day (QID) | INTRAVENOUS | Status: DC | PRN

## 2022-04-30 MED ORDER — SODIUM CHLORIDE 0.9 % IV SOLN: INTRAVENOUS | Status: DC

## 2022-04-30 MED ORDER — CHLORHEXIDINE GLUCONATE CLOTH 2 % EX PADS: 6.0000 | MEDICATED_PAD | Freq: Once | CUTANEOUS | Status: DC

## 2022-04-30 MED ORDER — SENNA 8.6 MG PO TABS
1.0000 | ORAL_TABLET | Freq: Two times a day (BID) | ORAL | Status: DC
  Administered 2022-04-30 – 2022-05-01 (×2): 8.6 mg via ORAL
  Filled 2022-04-30 (×2): qty 1

## 2022-04-30 MED ORDER — 0.9 % SODIUM CHLORIDE (POUR BTL) OPTIME
TOPICAL | Status: DC | PRN
  Administered 2022-04-30: 1000 mL

## 2022-04-30 MED ORDER — PANTOPRAZOLE SODIUM 40 MG IV SOLR: 40.0000 mg | Freq: Every day | INTRAVENOUS | Status: DC

## 2022-04-30 MED ORDER — SUGAMMADEX SODIUM 200 MG/2ML IV SOLN
INTRAVENOUS | Status: DC | PRN
  Administered 2022-04-30: 200 mg via INTRAVENOUS

## 2022-04-30 MED ORDER — OXYCODONE HCL 5 MG PO TABS
10.0000 mg | ORAL_TABLET | ORAL | Status: DC | PRN
  Administered 2022-04-30 – 2022-05-01 (×5): 10 mg via ORAL
  Filled 2022-04-30 (×5): qty 2

## 2022-04-30 MED ORDER — CHLORHEXIDINE GLUCONATE 0.12 % MT SOLN: 15.0000 mL | Freq: Once | OROMUCOSAL | Status: DC

## 2022-04-30 MED ORDER — LACTATED RINGERS IV SOLN: INTRAVENOUS | Status: DC

## 2022-04-30 MED ORDER — SUCCINYLCHOLINE CHLORIDE 200 MG/10ML IV SOSY
PREFILLED_SYRINGE | INTRAVENOUS | Status: AC
  Filled 2022-04-30: qty 10

## 2022-04-30 MED ORDER — FENTANYL CITRATE (PF) 100 MCG/2ML IJ SOLN
25.0000 ug | INTRAMUSCULAR | Status: DC | PRN
  Administered 2022-04-30 (×2): 50 ug via INTRAVENOUS

## 2022-04-30 MED ORDER — PROPOFOL 10 MG/ML IV BOLUS
INTRAVENOUS | Status: DC | PRN
  Administered 2022-04-30: 200 mg via INTRAVENOUS
  Administered 2022-04-30: 50 mg via INTRAVENOUS
  Administered 2022-04-30: 20 mg via INTRAVENOUS

## 2022-04-30 MED ORDER — SUCCINYLCHOLINE CHLORIDE 200 MG/10ML IV SOSY
PREFILLED_SYRINGE | INTRAVENOUS | Status: DC | PRN
  Administered 2022-04-30: 120 mg via INTRAVENOUS

## 2022-04-30 MED ORDER — MENTHOL 3 MG MT LOZG: 1.0000 | LOZENGE | OROMUCOSAL | Status: DC | PRN

## 2022-04-30 MED ORDER — ALBUTEROL SULFATE HFA 108 (90 BASE) MCG/ACT IN AERS: 2.0000 | INHALATION_SPRAY | RESPIRATORY_TRACT | Status: DC | PRN

## 2022-04-30 MED ORDER — PHENYLEPHRINE HCL-NACL 20-0.9 MG/250ML-% IV SOLN
INTRAVENOUS | Status: DC | PRN
  Administered 2022-04-30: 20 ug/min via INTRAVENOUS

## 2022-04-30 MED ORDER — BUPIVACAINE HCL (PF) 0.5 % IJ SOLN
INTRAMUSCULAR | Status: AC
  Filled 2022-04-30: qty 30

## 2022-04-30 MED ORDER — DEXAMETHASONE SODIUM PHOSPHATE 10 MG/ML IJ SOLN
INTRAMUSCULAR | Status: AC
  Filled 2022-04-30: qty 1

## 2022-04-30 MED ORDER — THROMBIN 5000 UNITS EX SOLR
OROMUCOSAL | Status: DC | PRN
  Administered 2022-04-30: 5 mL via TOPICAL

## 2022-04-30 MED ORDER — SODIUM CHLORIDE 0.9% FLUSH
3.0000 mL | Freq: Two times a day (BID) | INTRAVENOUS | Status: DC
  Administered 2022-04-30: 3 mL via INTRAVENOUS

## 2022-04-30 MED ORDER — KETOROLAC TROMETHAMINE 30 MG/ML IJ SOLN
30.0000 mg | Freq: Four times a day (QID) | INTRAMUSCULAR | Status: DC
  Administered 2022-04-30 – 2022-05-01 (×3): 30 mg via INTRAVENOUS
  Filled 2022-04-30 (×6): qty 1

## 2022-04-30 MED ORDER — MIDAZOLAM HCL 2 MG/2ML IJ SOLN
INTRAMUSCULAR | Status: AC
  Filled 2022-04-30: qty 2

## 2022-04-30 MED ORDER — CEFAZOLIN SODIUM-DEXTROSE 2-4 GM/100ML-% IV SOLN
2.0000 g | Freq: Three times a day (TID) | INTRAVENOUS | Status: AC
  Administered 2022-04-30 – 2022-05-01 (×2): 2 g via INTRAVENOUS
  Filled 2022-04-30 (×2): qty 100

## 2022-04-30 MED ORDER — ACETAMINOPHEN 325 MG PO TABS
650.0000 mg | ORAL_TABLET | ORAL | Status: DC | PRN
  Administered 2022-04-30 – 2022-05-01 (×2): 650 mg via ORAL
  Filled 2022-04-30 (×2): qty 2

## 2022-04-30 MED ORDER — ONDANSETRON HCL 4 MG/2ML IJ SOLN
INTRAMUSCULAR | Status: DC | PRN
  Administered 2022-04-30: 4 mg via INTRAVENOUS

## 2022-04-30 MED ORDER — FLEET ENEMA 7-19 GM/118ML RE ENEM: 1.0000 | ENEMA | Freq: Once | RECTAL | Status: DC | PRN

## 2022-04-30 MED ORDER — CEFAZOLIN SODIUM-DEXTROSE 2-4 GM/100ML-% IV SOLN
2.0000 g | INTRAVENOUS | Status: AC
  Administered 2022-04-30: 2 g via INTRAVENOUS
  Filled 2022-04-30: qty 100

## 2022-04-30 MED ORDER — ONDANSETRON HCL 4 MG PO TABS: 4.0000 mg | ORAL_TABLET | Freq: Four times a day (QID) | ORAL | Status: DC | PRN

## 2022-04-30 MED ORDER — ORAL CARE MOUTH RINSE: 15.0000 mL | Freq: Once | OROMUCOSAL | Status: DC

## 2022-04-30 MED ORDER — THROMBIN 20000 UNITS EX SOLR
CUTANEOUS | Status: AC
  Filled 2022-04-30: qty 20000

## 2022-04-30 MED ORDER — PHENOL 1.4 % MT LIQD: 1.0000 | OROMUCOSAL | Status: DC | PRN

## 2022-04-30 MED ORDER — PROPOFOL 10 MG/ML IV BOLUS
INTRAVENOUS | Status: AC
  Filled 2022-04-30: qty 20

## 2022-04-30 MED ORDER — HYDROMORPHONE HCL 1 MG/ML IJ SOLN
1.0000 mg | INTRAMUSCULAR | Status: DC | PRN
  Administered 2022-04-30: 1 mg via INTRAVENOUS
  Filled 2022-04-30: qty 1

## 2022-04-30 MED ORDER — FENTANYL CITRATE (PF) 250 MCG/5ML IJ SOLN
INTRAMUSCULAR | Status: AC
  Filled 2022-04-30: qty 5

## 2022-04-30 MED ORDER — FENTANYL CITRATE (PF) 250 MCG/5ML IJ SOLN
INTRAMUSCULAR | Status: DC | PRN
  Administered 2022-04-30 (×3): 50 ug via INTRAVENOUS
  Administered 2022-04-30: 150 ug via INTRAVENOUS

## 2022-04-30 MED ORDER — TRAZODONE HCL 50 MG PO TABS
100.0000 mg | ORAL_TABLET | Freq: Every day | ORAL | Status: DC
  Administered 2022-04-30: 100 mg via ORAL
  Filled 2022-04-30: qty 2

## 2022-04-30 MED ORDER — BUPIVACAINE HCL (PF) 0.5 % IJ SOLN
INTRAMUSCULAR | Status: DC | PRN
  Administered 2022-04-30: 5 mL

## 2022-04-30 MED ORDER — ROCURONIUM BROMIDE 10 MG/ML (PF) SYRINGE
PREFILLED_SYRINGE | INTRAVENOUS | Status: DC | PRN
  Administered 2022-04-30: 10 mg via INTRAVENOUS
  Administered 2022-04-30: 30 mg via INTRAVENOUS
  Administered 2022-04-30: 60 mg via INTRAVENOUS
  Administered 2022-04-30: 40 mg via INTRAVENOUS

## 2022-04-30 MED ORDER — GLYCOPYRROLATE PF 0.2 MG/ML IJ SOSY
PREFILLED_SYRINGE | INTRAMUSCULAR | Status: DC | PRN
  Administered 2022-04-30: .2 mg via INTRAVENOUS

## 2022-04-30 MED ORDER — DEXAMETHASONE SODIUM PHOSPHATE 10 MG/ML IJ SOLN
INTRAMUSCULAR | Status: DC | PRN
  Administered 2022-04-30: 10 mg via INTRAVENOUS

## 2022-04-30 MED ORDER — GABAPENTIN 300 MG PO CAPS
900.0000 mg | ORAL_CAPSULE | Freq: Three times a day (TID) | ORAL | Status: DC
  Administered 2022-04-30 – 2022-05-01 (×2): 900 mg via ORAL
  Filled 2022-04-30 (×2): qty 3

## 2022-04-30 MED ORDER — LIDOCAINE-EPINEPHRINE 1 %-1:100000 IJ SOLN
INTRAMUSCULAR | Status: AC
  Filled 2022-04-30: qty 1

## 2022-04-30 MED ORDER — PHENYLEPHRINE 80 MCG/ML (10ML) SYRINGE FOR IV PUSH (FOR BLOOD PRESSURE SUPPORT)
PREFILLED_SYRINGE | INTRAVENOUS | Status: DC | PRN
  Administered 2022-04-30 (×2): 80 ug via INTRAVENOUS
  Administered 2022-04-30: 40 ug via INTRAVENOUS

## 2022-04-30 MED ORDER — FENTANYL CITRATE (PF) 100 MCG/2ML IJ SOLN
INTRAMUSCULAR | Status: AC
  Filled 2022-04-30: qty 2

## 2022-04-30 MED ORDER — ORAL CARE MOUTH RINSE: 15.0000 mL | Freq: Once | OROMUCOSAL | Status: AC

## 2022-04-30 MED ORDER — BUSPIRONE HCL 15 MG PO TABS
15.0000 mg | ORAL_TABLET | Freq: Three times a day (TID) | ORAL | Status: DC
  Administered 2022-04-30 – 2022-05-01 (×2): 15 mg via ORAL
  Filled 2022-04-30 (×2): qty 1

## 2022-04-30 MED ORDER — ROCURONIUM BROMIDE 10 MG/ML (PF) SYRINGE
PREFILLED_SYRINGE | INTRAVENOUS | Status: AC
  Filled 2022-04-30: qty 20

## 2022-04-30 MED ORDER — DEXMEDETOMIDINE HCL IN NACL 80 MCG/20ML IV SOLN
INTRAVENOUS | Status: DC | PRN
  Administered 2022-04-30: 4 ug via BUCCAL
  Administered 2022-04-30: 8 ug via BUCCAL
  Administered 2022-04-30 (×4): 4 ug via BUCCAL

## 2022-04-30 MED ORDER — BISACODYL 10 MG RE SUPP: 10.0000 mg | Freq: Every day | RECTAL | Status: DC | PRN

## 2022-04-30 MED ORDER — ACETAMINOPHEN 10 MG/ML IV SOLN
INTRAVENOUS | Status: DC | PRN
  Administered 2022-04-30: 1000 mg via INTRAVENOUS

## 2022-04-30 MED ORDER — BACITRACIN ZINC 500 UNIT/GM EX OINT
TOPICAL_OINTMENT | CUTANEOUS | Status: AC
  Filled 2022-04-30: qty 28.35

## 2022-04-30 MED ORDER — ACETAMINOPHEN 650 MG RE SUPP: 650.0000 mg | RECTAL | Status: DC | PRN

## 2022-04-30 MED ORDER — MIDAZOLAM HCL 2 MG/2ML IJ SOLN
INTRAMUSCULAR | Status: DC | PRN
  Administered 2022-04-30: 2 mg via INTRAVENOUS

## 2022-04-30 MED ORDER — BACITRACIN ZINC 500 UNIT/GM EX OINT
TOPICAL_OINTMENT | CUTANEOUS | Status: DC | PRN
  Administered 2022-04-30 (×2): 1 via TOPICAL

## 2022-04-30 MED ORDER — ACETAMINOPHEN 10 MG/ML IV SOLN
INTRAVENOUS | Status: AC
  Filled 2022-04-30: qty 100

## 2022-04-30 MED ORDER — ONDANSETRON HCL 4 MG/2ML IJ SOLN
INTRAMUSCULAR | Status: AC
  Filled 2022-04-30: qty 2

## 2022-04-30 MED ORDER — POLYETHYLENE GLYCOL 3350 17 G PO PACK: 17.0000 g | PACK | Freq: Every day | ORAL | Status: DC | PRN

## 2022-04-30 MED ORDER — LIDOCAINE 2% (20 MG/ML) 5 ML SYRINGE
INTRAMUSCULAR | Status: AC
  Filled 2022-04-30: qty 5

## 2022-04-30 MED ORDER — ATORVASTATIN CALCIUM 10 MG PO TABS
20.0000 mg | ORAL_TABLET | Freq: Every day | ORAL | Status: DC
  Administered 2022-04-30: 20 mg via ORAL
  Filled 2022-04-30: qty 2

## 2022-04-30 MED ORDER — SODIUM CHLORIDE 0.9 % IV SOLN: 250.0000 mL | INTRAVENOUS | Status: DC

## 2022-04-30 MED ORDER — LIDOCAINE 2% (20 MG/ML) 5 ML SYRINGE
INTRAMUSCULAR | Status: DC | PRN
  Administered 2022-04-30: 60 mg via INTRAVENOUS

## 2022-04-30 MED ORDER — CHLORHEXIDINE GLUCONATE 0.12 % MT SOLN
15.0000 mL | Freq: Once | OROMUCOSAL | Status: AC
  Administered 2022-04-30: 15 mL via OROMUCOSAL
  Filled 2022-04-30: qty 15

## 2022-04-30 MED ORDER — METHOCARBAMOL 500 MG PO TABS
500.0000 mg | ORAL_TABLET | Freq: Four times a day (QID) | ORAL | Status: DC | PRN
  Administered 2022-04-30 – 2022-05-01 (×3): 500 mg via ORAL
  Filled 2022-04-30 (×3): qty 1

## 2022-04-30 MED ORDER — DEXMEDETOMIDINE HCL IN NACL 80 MCG/20ML IV SOLN
INTRAVENOUS | Status: AC
  Filled 2022-04-30: qty 20

## 2022-04-30 MED ORDER — ONDANSETRON HCL 4 MG/2ML IJ SOLN: 4.0000 mg | Freq: Four times a day (QID) | INTRAMUSCULAR | Status: DC | PRN

## 2022-04-30 MED ORDER — LIDOCAINE-EPINEPHRINE 1 %-1:100000 IJ SOLN
INTRAMUSCULAR | Status: DC | PRN
  Administered 2022-04-30: 5 mL

## 2022-04-30 MED ORDER — SODIUM CHLORIDE 0.9% FLUSH: 3.0000 mL | INTRAVENOUS | Status: DC | PRN

## 2022-04-30 MED ORDER — DOCUSATE SODIUM 100 MG PO CAPS
100.0000 mg | ORAL_CAPSULE | Freq: Two times a day (BID) | ORAL | Status: DC
  Administered 2022-04-30 – 2022-05-01 (×2): 100 mg via ORAL
  Filled 2022-04-30 (×2): qty 1

## 2022-04-30 MED ORDER — FLUOXETINE HCL 20 MG PO CAPS
60.0000 mg | ORAL_CAPSULE | Freq: Every day | ORAL | Status: DC
  Administered 2022-05-01: 60 mg via ORAL
  Filled 2022-04-30: qty 3

## 2022-04-30 SURGICAL SUPPLY — 65 items
BAG COUNTER SPONGE SURGICOUNT (BAG) ×1 IMPLANT
BAND RUBBER #18 3X1/16 STRL (MISCELLANEOUS) IMPLANT
BENZOIN TINCTURE PRP APPL 2/3 (GAUZE/BANDAGES/DRESSINGS) IMPLANT
BLADE CLIPPER SURG (BLADE) ×1 IMPLANT
BLADE ULTRA TIP 2M (BLADE) IMPLANT
BUR MATCHSTICK NEURO 3.0 LAGG (BURR) IMPLANT
BUR PRECISION FLUTE 5.0 (BURR) ×1 IMPLANT
CANISTER SUCT 3000ML PPV (MISCELLANEOUS) ×1 IMPLANT
CARTRIDGE OIL MAESTRO DRILL (MISCELLANEOUS) ×1 IMPLANT
COVERAGE SUPPORT O-ARM STEALTH (MISCELLANEOUS) ×1 IMPLANT
DIFFUSER DRILL AIR PNEUMATIC (MISCELLANEOUS) ×1 IMPLANT
DRAPE C-ARM 42X72 X-RAY (DRAPES) ×2 IMPLANT
DRAPE LAPAROTOMY 100X72 PEDS (DRAPES) ×1 IMPLANT
DRAPE MICROSCOPE LEICA 54X105 (DRAPES) IMPLANT
DRAPE SHEET LG 3/4 BI-LAMINATE (DRAPES) ×4 IMPLANT
DRSG OPSITE POSTOP 4X8 (GAUZE/BANDAGES/DRESSINGS) IMPLANT
DURAPREP 26ML APPLICATOR (WOUND CARE) IMPLANT
DURAPREP 6ML APPLICATOR 50/CS (WOUND CARE) ×1 IMPLANT
ELECT REM PT RETURN 9FT ADLT (ELECTROSURGICAL) ×1
ELECTRODE REM PT RTRN 9FT ADLT (ELECTROSURGICAL) ×1 IMPLANT
FEE COVERAGE SUPPORT O-ARM (MISCELLANEOUS) IMPLANT
GAUZE 4X4 16PLY ~~LOC~~+RFID DBL (SPONGE) IMPLANT
GAUZE SPONGE 4X4 12PLY STRL (GAUZE/BANDAGES/DRESSINGS) IMPLANT
GLOVE BIO SURGEON STRL SZ7.5 (GLOVE) IMPLANT
GLOVE BIOGEL PI IND STRL 7.5 (GLOVE) ×2 IMPLANT
GLOVE ECLIPSE 7.0 STRL STRAW (GLOVE) ×1 IMPLANT
GLOVE EXAM NITRILE XL STR (GLOVE) IMPLANT
GOWN STRL REUS W/ TWL LRG LVL3 (GOWN DISPOSABLE) IMPLANT
GOWN STRL REUS W/ TWL XL LVL3 (GOWN DISPOSABLE) ×1 IMPLANT
GOWN STRL REUS W/TWL 2XL LVL3 (GOWN DISPOSABLE) IMPLANT
GOWN STRL REUS W/TWL LRG LVL3 (GOWN DISPOSABLE)
GOWN STRL REUS W/TWL XL LVL3 (GOWN DISPOSABLE) ×1
GRAFT BN 5X1XSPNE CVD POST DBM (Bone Implant) IMPLANT
GRAFT BONE MAGNIFUSE 1X5CM (Bone Implant) ×1 IMPLANT
HEMOSTAT POWDER KIT SURGIFOAM (HEMOSTASIS) IMPLANT
KIT BASIN OR (CUSTOM PROCEDURE TRAY) ×1 IMPLANT
KIT INFUSE SMALL (Orthopedic Implant) IMPLANT
KIT TURNOVER KIT B (KITS) ×1 IMPLANT
MARKER SPHERE PSV REFLC NDI (MISCELLANEOUS) ×5 IMPLANT
NEEDLE HYPO 22GX1.5 SAFETY (NEEDLE) ×1 IMPLANT
NS IRRIG 1000ML POUR BTL (IV SOLUTION) ×1 IMPLANT
OIL CARTRIDGE MAESTRO DRILL (MISCELLANEOUS)
PACK LAMINECTOMY NEURO (CUSTOM PROCEDURE TRAY) ×1 IMPLANT
PAD ARMBOARD 7.5X6 YLW CONV (MISCELLANEOUS) ×3 IMPLANT
PIN MAYFIELD SKULL DISP (PIN) ×1 IMPLANT
ROD PRECUT 3.5X60 (Rod) IMPLANT
SCREW MA INFINITY 3.5X12 (Screw) IMPLANT
SCREW MULTI AXIAL 3.5X14MM (Screw) IMPLANT
SET SCREW INFINITY IFIX THOR (Screw) IMPLANT
SPIKE FLUID TRANSFER (MISCELLANEOUS) ×1 IMPLANT
SPONGE SURGIFOAM ABS GEL 100 (HEMOSTASIS) ×1 IMPLANT
SPONGE T-LAP 4X18 ~~LOC~~+RFID (SPONGE) IMPLANT
STAPLER VISISTAT 35W (STAPLE) ×1 IMPLANT
STRIP CLOSURE SKIN 1/2X4 (GAUZE/BANDAGES/DRESSINGS) IMPLANT
SUPPORT TECH COVERAGE MED NAV (MISCELLANEOUS) ×1
SUT ETHILON 3 0 FSL (SUTURE) IMPLANT
SUT VIC AB 0 CT1 18XCR BRD8 (SUTURE) ×1 IMPLANT
SUT VIC AB 0 CT1 8-18 (SUTURE) ×3
SUT VIC AB 2-0 CT1 18 (SUTURE) IMPLANT
SUT VICRYL 3-0 RB1 18 ABS (SUTURE) IMPLANT
TOWEL GREEN STERILE (TOWEL DISPOSABLE) ×1 IMPLANT
TOWEL GREEN STERILE FF (TOWEL DISPOSABLE) ×1 IMPLANT
UNDERPAD 30X36 HEAVY ABSORB (UNDERPADS AND DIAPERS) ×1 IMPLANT
WATER STERILE IRR 1000ML POUR (IV SOLUTION) ×1 IMPLANT
midas rex mr8 IMPLANT

## 2022-04-30 NOTE — Anesthesia Postprocedure Evaluation (Signed)
Anesthesia Post Note  Patient: Curtis Christensen  Procedure(s) Performed: POSTERIOR CERVICAL LAMINECTOMY CERVICAL FOUR-CERVICAL SEVEN WITH LATERAL MASS FIXATION AND POSTEROLATERAL FUSION Application of O-Arm     Patient location during evaluation: PACU Anesthesia Type: General Level of consciousness: awake and alert Pain management: pain level controlled Vital Signs Assessment: post-procedure vital signs reviewed and stable Respiratory status: spontaneous breathing, nonlabored ventilation, respiratory function stable and patient connected to nasal cannula oxygen Cardiovascular status: blood pressure returned to baseline and stable Postop Assessment: no apparent nausea or vomiting Anesthetic complications: no   No notable events documented.  Last Vitals:  Vitals:   04/30/22 1721 04/30/22 1936  BP: (!) 153/91 (!) 144/84  Pulse: 66 62  Resp: 19 18  Temp: 36.6 C 36.8 C  SpO2: 97% 95%    Last Pain:  Vitals:   04/30/22 2016  TempSrc:   PainSc: [place]

## 2022-04-30 NOTE — Transfer of Care (Signed)
Immediate Anesthesia Transfer of Care Note  Patient: Curtis Christensen  Procedure(s) Performed: POSTERIOR CERVICAL LAMINECTOMY CERVICAL FOUR-CERVICAL SEVEN WITH LATERAL MASS FIXATION AND POSTEROLATERAL FUSION Application of O-Arm  Patient Location: PACU  Anesthesia Type:General  Level of Consciousness: awake and patient cooperative  Airway & Oxygen Therapy: Patient Spontanous Breathing and Patient connected to face mask oxygen  Post-op Assessment: Report given to RN, Post -op Vital signs reviewed and stable and Patient moving all extremities X 4  Post vital signs: Reviewed and stable  Last Vitals:  Vitals Value Taken Time  BP 146/74 04/30/22 1608  Temp    Pulse 79 04/30/22 1611  Resp 15 04/30/22 1611  SpO2 95 % 04/30/22 1611  Vitals shown include unvalidated device data.  Last Pain:  Vitals:   04/30/22 1126  TempSrc:   PainSc: 3          Complications: No notable events documented.

## 2022-04-30 NOTE — Progress Notes (Signed)
Orthopedic Tech Progress Note Patient Details:  Curtis Christensen [birth year] [id number]  PACU RN called for an aspen cervical collar. Order was called into [facility].  Patient ID: Curtis Christensen, male   DOB: [birth year], 46 y.o.   MRN: [id number]  Arville Go 04/30/2022, 5:14 PM

## 2022-04-30 NOTE — H&P (Signed)
Chief Complaint   Left neck and arm pain  History of Present Illness  Curtis Christensen is a 46 y.o. male with a history of previous 3 level ACDF several years ago.  Although he did well initially, he has been complaining of progressively worsening primarily left-sided neck and arm pain, and more recently right-sided symptoms as well.  He has attempted conservative treatment without significant relief of his symptoms.  Follow-up imaging has revealed some continued stenosis.  He therefore elected to proceed with posterior decompression and fusion.  Past Medical History   Past Medical History:  Diagnosis Date   Anxiety    Asthma    Complication of anesthesia    Depression    GERD (gastroesophageal reflux disease)    Headache    History of accidental awareness under general anesthesia    Pneumonia    Pre-diabetes    PTSD (post-traumatic stress disorder)    Sleep apnea    Traumatic brain injury (HCC) 2004    Past Surgical History   Past Surgical History:  Procedure Laterality Date   CERVICAL FUSION  2021   C4-7 ACDF   FASCIOTOMY Left    labral tear repair Right    right shoulder   UMBILICAL HERNIA REPAIR  2016    Social History   Social History   Tobacco Use   Smoking status: Never   Smokeless tobacco: Never  Vaping Use   Vaping Use: Never used  Substance Use Topics   Alcohol use: No   Drug use: Yes    Types: Marijuana    Comment: daily    Medications   Prior to Admission medications   Medication Sig Start Date End Date Taking? Authorizing Provider  albuterol (VENTOLIN HFA) 108 (90 Base) MCG/ACT inhaler Inhale 2 puffs into the lungs every 4 (four) hours as needed for shortness of breath or wheezing (Coughing). 08/01/20  Yes [provider]  atorvastatin (LIPITOR) 20 MG tablet Take 1 tablet (20 mg total) by mouth daily. 09/06/21  Yes Abonza, Maritza, PA-C  busPIRone (BUSPAR) 15 MG tablet Take 15 mg by mouth 3 (three) times daily. 01/14/22 01/15/23 Yes [provider]  cyclobenzaprine (FLEXERIL) 10 MG tablet Take 1 tablet (10 mg total) by mouth 3 (three) times daily as needed for muscle spasms. 09/06/21  Yes Abonza, Maritza, PA-C  FLUoxetine (PROZAC) 20 MG capsule Take 60 mg by mouth daily. 08/21/21  Yes [provider]  gabapentin (NEURONTIN) 300 MG capsule Take 900 mg by mouth 3 (three) times daily.   Yes [provider]  ibuprofen (ADVIL,MOTRIN) 800 MG tablet Take 1 tablet (800 mg total) by mouth every 8 (eight) hours as needed for mild pain or moderate pain. 06/26/15  Yes West, Emily, PA-C  Omeprazole 20 MG TBDD Take 20 mg by mouth daily.   Yes [provider]  traZODone (DESYREL) 100 MG tablet Take 100 mg by mouth at bedtime.   Yes [provider]    Allergies   Allergies  Allergen Reactions   Buprenorphine-Naloxone Itching, Nausea And Vomiting, Other (See Comments) and Hives   Suboxone [Buprenorphine Hcl-Naloxone Hcl] Itching, Nausea And Vomiting, Hives and Other (See Comments)   Escitalopram Oxalate Nausea And Vomiting   Lexapro [Escitalopram] Rash   Sertraline Nausea Only and Nausea And Vomiting    Review of Systems  ROS  Neurologic Exam  Awake, alert, oriented Memory and concentration grossly intact Speech fluent, appropriate CN grossly intact Motor exam: Upper Extremities Deltoid Bicep Tricep Grip  Right  5/5 5/5 5/5 5/5  Left 5/5 5/5 5/5 5/5   Lower Extremities IP Quad PF DF EHL  Right 5/5 5/5 5/5 5/5 5/5  Left 5/5 5/5 5/5 5/5 5/5   Sensation grossly intact to LT  Imaging  MRI of the cervical spine dated 03/15/2021 was personally reviewed.  This demonstrates susceptibility artifact from the interbody devices and anterior plate and screws from C4-5 through C6-7.  There does continue to be relatively severe left-sided foraminal stenosis at C5-6 as well as some left-sided foraminal stenosis at C3-4.  Impression  - 46 y.o. male with recurrent neck and left greater than right arm pain  several years after C4-5 through C6-7 ACDF.  Imaging does reveal residual stenosis and partial interbody fusion.  Plan  -We will plan on proceeding with posterior cervical decompression and lateral mass fusion  I have reviewed the details of the operation as well as the expected postoperative course at length with the patient and his wife in the office.  We have also discussed the associated risks, benefits, and alternatives to surgery.  All his questions were answered and he provided informed consent to proceed.   Consuella Lose, MD [facility]

## 2022-04-30 NOTE — Anesthesia Procedure Notes (Addendum)
Procedure Name: Intubation Date/Time: 04/30/2022 12:16 PM  Performed by: Lorie Phenix, CRNAPre-anesthesia Checklist: Patient identified, Emergency Drugs available, Suction available and Patient being monitored Patient Re-evaluated:Patient Re-evaluated prior to induction Oxygen Delivery Method: Circle System Utilized Preoxygenation: Pre-oxygenation with 100% oxygen Induction Type: IV induction and Rapid sequence Laryngoscope Size: Glidescope and 4 Grade View: Grade I Tube type: Oral Tube size: 7.5 mm Number of attempts: 1 Airway Equipment and Method: Stylet Placement Confirmation: ETT inserted through vocal cords under direct vision, positive ETCO2 and breath sounds checked- equal and bilateral Secured at: 24 cm Tube secured with: Tape Dental Injury: Teeth and Oropharynx as per pre-operative assessment  Comments: Intubated by Revonda Humphrey, SRNA under direct supervision of CRNA and MDA.

## 2022-05-01 ENCOUNTER — Encounter (HOSPITAL_COMMUNITY): Payer: Self-pay | Admitting: Neurosurgery

## 2022-05-01 ENCOUNTER — Other Ambulatory Visit: Payer: Self-pay

## 2022-05-01 MED ORDER — ACETAMINOPHEN 325 MG PO TABS: 650.0000 mg | ORAL_TABLET | Freq: Four times a day (QID) | ORAL | Status: AC | PRN

## 2022-05-01 MED ORDER — METHOCARBAMOL 500 MG PO TABS: 500.0000 mg | ORAL_TABLET | Freq: Four times a day (QID) | ORAL | 0 refills | Status: AC | PRN

## 2022-05-01 MED ORDER — OXYCODONE HCL 10 MG PO TABS: 10.0000 mg | ORAL_TABLET | Freq: Four times a day (QID) | ORAL | 0 refills | Status: AC | PRN

## 2022-05-01 NOTE — Evaluation (Signed)
Occupational Therapy Evaluation Patient Details Name: Curtis Christensen MRN: [id number] DOB: 1976/07/14 Today's Date: 05/01/2022   History of Present Illness 46 yo male s/p 04/30/22 posterior cervical laminectomy C4-C7 with lateral mass fixation and Posterolateral fusion PMH ACDF C4-7 2021, fasciotomy, R labral tear repair, anxiety, asthma, depression, prediabetic, PTSD TBI  apsen collar   Clinical Impression   Patient evaluated by Occupational Therapy with no further acute OT needs identified. All education has been completed and the patient has no further questions. See below for any follow-up Occupational Therapy or equipment needs. OT to sign off. Thank you for referral.        Recommendations for follow up therapy are one component of a multi-disciplinary discharge planning process, led by the attending physician.  Recommendations may be updated based on patient status, additional functional criteria and insurance authorization.   Follow Up Recommendations  No OT follow up    Assistance Recommended at Discharge None  Patient can return home with the following      Functional Status Assessment  Patient has had a recent decline in their functional status and demonstrates the ability to make significant improvements in function in a reasonable and predictable amount of time.  Equipment Recommendations  None recommended by OT    Recommendations for Other Services       Precautions / Restrictions Precautions Precautions: Cervical Precaution Comments: handout provided and reviewed for cervical precautions Required Braces or Orthoses: Cervical Brace Cervical Brace: Hard collar      Mobility Bed Mobility Overal bed mobility: Independent                  Transfers Overall transfer level: Independent                        Balance Overall balance assessment: Independent                                         ADL either performed or assessed  with clinical judgement   ADL Overall ADL's : Independent                                       General ADL Comments: educated on don doff brace, changing of pads, clean wash cloth and washing around the incision     Vision Patient Visual Report: No change from baseline Additional Comments: wears glasses but does not have them with him as he says he is going back to doctor to get new pair and the old ones are scratched     Perception     Praxis      Pertinent Vitals/Pain Pain Assessment Pain Assessment: Faces Faces Pain Scale: Hurts a little bit Pain Location: neck Pain Descriptors / Indicators: Operative site guarding Pain Intervention(s): Monitored during session, Premedicated before session, Repositioned     Hand Dominance Right   Extremity/Trunk Assessment Upper Extremity Assessment Upper Extremity Assessment: Overall WFL for tasks assessed   Lower Extremity Assessment Lower Extremity Assessment: Overall WFL for tasks assessed   Cervical / Trunk Assessment Cervical / Trunk Assessment: Neck Surgery   Communication Communication Communication: No difficulties   Cognition Arousal/Alertness: Awake/alert Behavior During Therapy: WFL for tasks assessed/performed Overall Cognitive Status: Within Functional Limits for tasks assessed  General Comments  brace adjusted to proper fit and pt reports "oh that feels better"    Exercises     Shoulder Instructions      Home Living Family/patient expects to be discharged to:: Private residence Living Arrangements: Spouse/significant other Available Help at Discharge: Family;Other (Comment) (3 teenage boys 14 18 59) Type of Home: House Home Access: Stairs to enter Technical brewer of Steps: 2 Entrance Stairs-Rails: None Home Layout: Two level;Bed/bath upstairs Alternate Level Stairs-Number of Steps: 15 Alternate Level Stairs-Rails:  Right Bathroom Shower/Tub: Teacher, early years/pre: Standard     Home Equipment: None   Additional Comments: not working, wife can work from home and has taken sometime off to help him at home.      Prior Functioning/Environment Prior Level of Function : Driving                        OT Problem List:        OT Treatment/Interventions:      OT Goals(Current goals can be found in the care plan section) Acute Rehab OT Goals Patient Stated Goal: none stated during session Potential to Achieve Goals: Good  OT Frequency:      Co-evaluation              AM-PAC OT "6 Clicks" Daily Activity     Outcome Measure Help from another person eating meals?: None Help from another person taking care of personal grooming?: None Help from another person toileting, which includes using toliet, bedpan, or urinal?: None Help from another person bathing (including washing, rinsing, drying)?: None Help from another person to put on and taking off regular upper body clothing?: None Help from another person to put on and taking off regular lower body clothing?: None 6 Click Score: 24   End of Session Nurse Communication: Mobility status;Precautions  Activity Tolerance: Patient tolerated treatment well Patient left: in bed;with call bell/phone within reach  OT Visit Diagnosis: Unsteadiness on feet (R26.81)                Time: 8413-2440 OT Time Calculation (min): 24 min Charges:  OT General Charges $OT Visit: 1 Visit OT Evaluation $OT Eval Moderate Complexity: 1 Mod   Brynn, OTR/L  Acute Rehabilitation Services Office: [phone] .   Jeri Modena 05/01/2022, 9:22 AM

## 2022-05-01 NOTE — Op Note (Signed)
NEUROSURGERY OPERATIVE NOTE   PREOP DIAGNOSIS:  Cervical spondylosis with radiculopathy, C5-6, C6-7   POSTOP DIAGNOSIS: Same  PROCEDURE: Bilateral C5-6 foraminotomy Right C6-7 foraminotomy Posterior segmental instrumentation C4-C7 lateral mass screws - [employer]  Posterolateral arthrodesis, C4-5, C5-6, C6-7 Use of non-structural bone allograft - Magnifuse, BMP Use of intraoperative microscope for microdissection Use of intraoperative stereotactic navigation  SURGEON: Dr. Lisbeth Renshaw, MD  ASSISTANT: Dr. Monia Pouch, MD  ANESTHESIA: General Endotracheal  EBL: 50cc  SPECIMENS: None  DRAINS: None  COMPLICATIONS: None immediate  CONDITION: Hemodynamically stable to PACU  HISTORY: Curtis Christensen is a 46 y.o. male I have been following in the outpatient neurosurgery clinic for neck and left greater than right arm pain.  Patient has previously undergone C4-5 through C6-7 ACDF.  While he did have transient improvement in his symptoms, he presented again with recurrent neck and arm pain.  Imaging did reveal continued foraminal stenosis bilaterally at C5-6 and more on the right at C6-7.  There was some interbody fusion however did not appear to be terribly robust.  After trial of conservative treatment, without significant improvement in symptoms, he elected to proceed with posterior decompression and instrumented stabilization and fusion.  The risks, benefits, and alternatives to surgery were all reviewed in detail with the patient and his wife.  After all questions were answered informed consent was obtained and witnessed.  PROCEDURE IN DETAIL: The patient was brought to the operating room. After induction of general anesthesia, the patient was positioned on the operative table in the [place] head holder in the prone position. All pressure points were meticulously padded.  Care was taken to ensure neutral spinal alignment.  Midline skin incision was then marked out and prepped and  draped in the usual sterile fashion.  After timeout was conducted, the midline skin incision was infiltrated with local anesthetic with epinephrine.  Incision was then made sharply and carried down through the subcutaneous tissue.  The nuchal fascia was identified and incised, and the suboccipital musculature was divided in the midline avascular plane.  Spinous processes were then identified.  Subperiosteal dissection was carried out.  A penetrating towel clip was placed on the C3 spinous process.  Lateral fluoroscopy was used to confirm our location.  Subperiosteal dissection was further carried out laterally to identify the lateral mass of the C4, C5, C6, and C7 vertebral bodies.  At this point a spinous process clamp was placed on the C3 spinous process.  Intraoperative CT scan was taken.  Good accuracy of the registration was confirmed.  I then utilized the stereotactic system to aid in the creation of pilot holes for bilateral C4, C5, C6, and C7 lateral mass screws.  These pilot holes were checked with a sound and then tapped to a depth of 12 mm throughout with the exception of the left C6 which was tapped to 14 mm.  At this point attention was turned to decompression.  The microscope was draped sterilely and the foraminotomies were completed under the microscope using microdissection technique.  Identifying the interspace on the left at C5-6 and the medial aspect of the facet complex, the ligamentum flavum was dissected using a small hook.  The high-speed drill was then used to create a very small lateral laminotomy at C5-6.  The drill was then used to drill out the facet complex at C5-6 taking care to preserve the bulk of the lateral mass for placement of the screws.  Using thin plate Kerrison punches, the foraminotomies was completed and  the nerve root was identified and completely unroofed throughout the entire foramen.    In a similar fashion, foraminotomies was completed on the right at C5-6 and  on the right at C6-7.  This included removal of essentially the entire roof of the foramina at these levels also.  Again, the C6 and C7 nerve roots were completely visualized throughout their course in the foramen confirming decompression.  Having completed the decompression, hemostasis was easily secured using a combination of bipolar electrocautery and morselized Gelfoam and thrombin.  High-speed drill was then used to decorticate the lamina at C4, C5, C6, and C7 as well as the spinous processes and the intervening facet complexes at these levels in order to facilitate posterior lateral arthrodesis.  Previously drilled pilot holes were identified, and 12 mm lateral mass screws were placed bilaterally at C4-C5-C6 and C7, with the exception of the left C6 in which a 14 mm screw was placed.  Pre-bent 60 mm lordotic rod was then sized and placed into the lateral mass screws.  Setscrews were placed and final tightened.  BMP and 5 cm strips of Magnifuse were then placed over the decorticated bone surfaces for posterolateral arthrodesis.  Wound was then irrigated with normal saline irrigation.  Self-retaining retractors were removed.  No active bleeding from the muscle edges was identified.  Wound was then closed in standard fashion in multiple layers using a combination of interrupted 0 and 3-0 Vicryl stitches.  Skin was closed with staples.  Bacitracin ointment and sterile dressing was applied.  Patient was then transferred to the stretcher and the Mayfield head holder was removed.  He was then extubated and taken to the postanesthesia care unit in stable hemodynamic condition.  At the end of the case all sponge, needle, instrument, and cottonoid counts were correct.   Consuella Lose, MD [facility]

## 2022-05-01 NOTE — Discharge Summary (Signed)
Physician Discharge Summary  Patient ID: Curtis Christensen MRN: [id number] DOB/AGE: May 24, 1976 46 y.o.  Admit date: 04/30/2022 Discharge date: 05/01/2022  Admission Diagnoses:  Cervical spondylosis with radiculopathy  Discharge Diagnoses:  Same Principal Problem:   Cervical spondylosis with radiculopathy   Discharged Condition: Stable  Hospital Course:  Curtis Christensen is a 46 y.o. male admitted after elective posterior cervical decompression and instrumented stabilization and fusion.  Patient was at neurologic baseline postoperatively.  He had improvement in his arm pain.  Neck pain was controlled with oral medication.  He was ambulating well, tolerating diet, voiding normally and requested discharge.  Treatments: Surgery -bilateral C5-6 and right C6-7 posterior cervical foraminotomies with C4-C7 lateral mass fusion  Discharge Exam: Blood pressure (!) 143/83, pulse 78, temperature 98.6 F (37 C), temperature source Oral, resp. rate 18, height 5\' 7"  (1.702 m), weight 95.3 kg, SpO2 97 %. Awake, alert, oriented Speech fluent, appropriate CN grossly intact 5/5 BUE/BLE Wound c/d/i  Disposition: Discharge disposition: 01-Home or Self Care       Discharge Instructions     Call MD for:  redness, tenderness, or signs of infection (pain, swelling, redness, odor or green/yellow discharge around incision site)   Complete by: As directed    Call MD for:  temperature >100.4   Complete by: As directed    Diet - low sodium heart healthy   Complete by: As directed    Discharge instructions   Complete by: As directed    Walk at home as much as possible, at least 4 times / day   Incentive spirometry RT   Complete by: As directed    Increase activity slowly   Complete by: As directed    Lifting restrictions   Complete by: As directed    No lifting > 10 lbs   May shower / Bathe   Complete by: As directed    48 hours after surgery   May walk up steps   Complete by: As directed     Other Restrictions   Complete by: As directed    No bending/twisting at waist   Remove dressing in 24 hours   Complete by: As directed       Allergies as of 05/01/2022       Reactions   Buprenorphine-naloxone Itching, Nausea And Vomiting, Other (See Comments), Hives   Suboxone [buprenorphine Hcl-naloxone Hcl] Itching, Nausea And Vomiting, Hives, Other (See Comments)   Escitalopram Oxalate Nausea And Vomiting   Lexapro [escitalopram] Rash   Sertraline Nausea Only, Nausea And Vomiting        Medication List     STOP taking these medications    cyclobenzaprine 10 MG tablet Commonly known as: FLEXERIL       TAKE these medications    acetaminophen 325 MG tablet Commonly known as: TYLENOL Take 2 tablets (650 mg total) by mouth every 6 (six) hours as needed for mild pain ((score 1 to 3) or temp > 100.5).   albuterol 108 (90 Base) MCG/ACT inhaler Commonly known as: VENTOLIN HFA Inhale 2 puffs into the lungs every 4 (four) hours as needed for shortness of breath or wheezing (Coughing).   atorvastatin 20 MG tablet Commonly known as: LIPITOR Take 1 tablet (20 mg total) by mouth daily.   busPIRone 15 MG tablet Commonly known as: BUSPAR Take 15 mg by mouth 3 (three) times daily.   FLUoxetine 20 MG capsule Commonly known as: PROZAC Take 60 mg by mouth daily.   gabapentin 300 MG capsule Commonly  known as: NEURONTIN Take 900 mg by mouth 3 (three) times daily.   ibuprofen 800 MG tablet Commonly known as: ADVIL Take 1 tablet (800 mg total) by mouth every 8 (eight) hours as needed for mild pain or moderate pain.   methocarbamol 500 MG tablet Commonly known as: ROBAXIN Take 1 tablet (500 mg total) by mouth every 6 (six) hours as needed for muscle spasms.   Omeprazole 20 MG Tbdd Take 20 mg by mouth daily.   Oxycodone HCl 10 MG Tabs Take 1 tablet (10 mg total) by mouth every 6 (six) hours as needed for up to 7 days for severe pain.   traZODone 100 MG tablet Commonly  known as: DESYREL Take 100 mg by mouth at bedtime.        Follow-up Information     Lisbeth Renshaw, MD Follow up.   Specialty: Neurosurgery Contact information: [address] [place] [address] [phone]                 Signed: Jackelyn Hoehn 05/01/2022, 9:55 AM

## 2022-05-01 NOTE — Progress Notes (Signed)
PT Cancellation Note  Patient Details Name: Curtis Christensen MRN: [id number] DOB: Apr 20, 1976   Cancelled Treatment:    Reason Eval/Treat Not Completed: PT screened, no needs identified, will sign off   Alvira Philips 05/01/2022, 9:08 AM Anhthu Perdew M,PT Acute Rehab Services [phone]

## 2022-05-01 NOTE — Progress Notes (Signed)
Patient alert and oriented, voiding adequately, skin clean, dry and intact without evidence of skin break down, or symptoms of complications - no redness or edema noted, only slight tenderness at site.   Extra dressing given to patient for home use. Patient states pain is manageable at time of discharge. Patient has an appointment with MD in 2 weeks  

## 2022-05-02 ENCOUNTER — Encounter (HOSPITAL_COMMUNITY): Payer: Self-pay | Admitting: Neurosurgery

## 2022-07-28 ENCOUNTER — Emergency Department
Admission: EM | Admit: 2022-07-28 | Discharge: 2022-07-28 | Discharge status: Against Medical Advice | Payer: No Typology Code available for payment source | Attending: Emergency Medicine | Admitting: Emergency Medicine

## 2022-07-28 ENCOUNTER — Other Ambulatory Visit: Payer: Self-pay

## 2022-07-28 DIAGNOSIS — Z5321 Procedure and treatment not carried out due to patient leaving prior to being seen by health care provider: Secondary | ICD-10-CM | POA: Insufficient documentation

## 2022-07-28 DIAGNOSIS — G5132 Clonic hemifacial spasm, left: Secondary | ICD-10-CM | POA: Diagnosis present

## 2022-07-28 DIAGNOSIS — R1310 Dysphagia, unspecified: Secondary | ICD-10-CM | POA: Insufficient documentation

## 2022-07-28 NOTE — ED Provider Triage Note (Signed)
Emergency Medicine Provider Triage Evaluation Note  Curtis Christensen , a 46 y.o. male  was evaluated in triage.  Pt complains of sensation of esophageal spasms after he tried to clear his throat. He took liquid Benadryl and then felt like he had facial spasms. History of TBI.   Physical Exam  BP (!) 143/85   Pulse 65   Temp 98.2 F (36.8 C) (Oral)   Resp 16   Ht 5\' 7"  (1.702 m)   Wt 97.5 kg   SpO2 96%   BMI 33.67 kg/m  Gen:   Awake, no distress   Resp:  Normal effort  MSK:   Moves extremities without difficulty  Other:    Medical Decision Making  Medically screening exam initiated at 5:00 PM.  Appropriate orders placed.  Orien Mayhall was informed that the remainder of the evaluation will be completed by another provider, this initial triage assessment does not replace that evaluation, and the importance of remaining in the ED until their evaluation is complete.   Thane Edu, FNP 07/28/22 1702

## 2022-07-28 NOTE — ED Triage Notes (Signed)
Pt to ER for   Pt felt like he was clearing his throat and felt like his throat was swollen. Had difficulty swallowing. It has gotten better since he has taken some OTC benadryl. Pt also had some spasms in the left side of his face post benadryl. Pt is CAOx4 and in no acute distress at this time. Airway intact and pt able to speak in full sentences.

## 2022-07-28 NOTE — ED Notes (Signed)
Pt has no noted swelling at this time.

## 2022-08-29 ENCOUNTER — Telehealth: Payer: Self-pay

## 2022-08-29 NOTE — Telephone Encounter (Signed)
Please contact [place] at [phone] for [facility] to recert pt to our office.

## 2022-09-02 NOTE — Telephone Encounter (Signed)
LVM for Abby O'Donald to return call. Terrea Bruster Zimmerman Rumple, CMA

## 2022-09-03 NOTE — Telephone Encounter (Signed)
Faxed form from [place], attention [employer].  She said to go ahead and send with office note so they can start the recertification process for [place] to continue to be his PCP. Curtis Christensen, CMA

## 2022-11-14 ENCOUNTER — Other Ambulatory Visit: Payer: Self-pay

## 2022-11-14 DIAGNOSIS — E785 Hyperlipidemia, unspecified: Secondary | ICD-10-CM

## 2022-11-14 DIAGNOSIS — Z0000 Encounter for general adult medical examination without abnormal findings: Secondary | ICD-10-CM

## 2022-11-14 DIAGNOSIS — Z130 Encounter for screening for diseases of the blood and blood-forming organs and certain disorders involving the immune mechanism: Secondary | ICD-10-CM

## 2022-11-18 ENCOUNTER — Other Ambulatory Visit: Payer: No Typology Code available for payment source

## 2022-11-18 DIAGNOSIS — E785 Hyperlipidemia, unspecified: Secondary | ICD-10-CM

## 2022-11-18 DIAGNOSIS — Z0000 Encounter for general adult medical examination without abnormal findings: Secondary | ICD-10-CM

## 2022-11-18 DIAGNOSIS — Z130 Encounter for screening for diseases of the blood and blood-forming organs and certain disorders involving the immune mechanism: Secondary | ICD-10-CM

## 2022-11-19 LAB — LIPID PANEL
Chol/HDL Ratio: 8.1 ratio — ABNORMAL HIGH (ref 0.0–5.0)
Cholesterol, Total: 252 mg/dL — ABNORMAL HIGH (ref 100–199)
HDL: 31 mg/dL — ABNORMAL LOW (ref >39)
LDL Chol Calc (NIH): 194 mg/dL — ABNORMAL HIGH (ref 0–99)
Triglycerides: 143 mg/dL (ref 0–149)
VLDL Cholesterol Cal: 27 mg/dL (ref 5–40)

## 2022-11-19 LAB — COMPREHENSIVE METABOLIC PANEL
ALT: 15 IU/L (ref 0–44)
AST: 16 IU/L (ref 0–40)
Albumin/Globulin Ratio: 1.9 (ref 1.2–2.2)
Albumin: 4.4 g/dL (ref 4.1–5.1)
Alkaline Phosphatase: 92 IU/L (ref 44–121)
BUN/Creatinine Ratio: 8 — ABNORMAL LOW (ref 9–20)
BUN: 10 mg/dL (ref 6–24)
Bilirubin Total: 0.4 mg/dL (ref 0.0–1.2)
CO2: 20 mmol/L (ref 20–29)
Calcium: 8.9 mg/dL (ref 8.7–10.2)
Chloride: 101 mmol/L (ref 96–106)
Creatinine, Ser: 1.22 mg/dL (ref 0.76–1.27)
Globulin, Total: 2.3 g/dL (ref 1.5–4.5)
Glucose: 94 mg/dL (ref 70–99)
Potassium: 4.1 mmol/L (ref 3.5–5.2)
Sodium: 136 mmol/L (ref 134–144)
Total Protein: 6.7 g/dL (ref 6.0–8.5)
eGFR: 75 mL/min/{1.73_m2} (ref >59)

## 2022-11-19 LAB — HEMOGLOBIN A1C
Est. average glucose Bld gHb Est-mCnc: 120 mg/dL
Hgb A1c MFr Bld: 5.8 % — ABNORMAL HIGH (ref 4.8–5.6)

## 2022-11-19 LAB — CBC
Hematocrit: 41.9 % (ref 37.5–51.0)
Hemoglobin: 14.7 g/dL (ref 13.0–17.7)
MCH: 31.2 pg (ref 26.6–33.0)
MCHC: 35.1 g/dL (ref 31.5–35.7)
MCV: 89 fL (ref 79–97)
Platelets: 254 10*3/uL (ref 150–450)
RBC: 4.71 x10E6/uL (ref 4.14–5.80)
RDW: 13.1 % (ref 11.6–15.4)
WBC: 7 10*3/uL (ref 3.4–10.8)

## 2022-11-19 LAB — TSH: TSH: 0.409 u[IU]/mL — ABNORMAL LOW (ref 0.450–4.500)

## 2022-11-25 ENCOUNTER — Ambulatory Visit (INDEPENDENT_AMBULATORY_CARE_PROVIDER_SITE_OTHER): Payer: No Typology Code available for payment source | Admitting: Family Medicine

## 2022-11-25 ENCOUNTER — Encounter: Payer: Self-pay | Admitting: Family Medicine

## 2022-11-25 VITALS — BP 126/74 | HR 68 | Resp 18 | Ht 67.0 in | Wt 226.0 lb

## 2022-11-25 DIAGNOSIS — Z1211 Encounter for screening for malignant neoplasm of colon: Secondary | ICD-10-CM

## 2022-11-25 DIAGNOSIS — R7989 Other specified abnormal findings of blood chemistry: Secondary | ICD-10-CM

## 2022-11-25 DIAGNOSIS — Z1159 Encounter for screening for other viral diseases: Secondary | ICD-10-CM | POA: Diagnosis not present

## 2022-11-25 DIAGNOSIS — E785 Hyperlipidemia, unspecified: Secondary | ICD-10-CM | POA: Diagnosis not present

## 2022-11-25 DIAGNOSIS — H02826 Cysts of left eye, unspecified eyelid: Secondary | ICD-10-CM

## 2022-11-25 DIAGNOSIS — Z1212 Encounter for screening for malignant neoplasm of rectum: Secondary | ICD-10-CM

## 2022-11-25 MED ORDER — ATORVASTATIN CALCIUM 40 MG PO TABS: 40.0000 mg | ORAL_TABLET | Freq: Every day | ORAL | 3 refills | Status: AC

## 2022-11-25 NOTE — Progress Notes (Signed)
Complete physical exam  Patient: Curtis Christensen   DOB: 10-06-1975   46 y.o. Male  MRN: [id number]  Subjective:    Chief Complaint  Patient presents with   Annual Exam    Curtis Christensen is a 47 y.o. male who presents today for a complete physical exam.  He is with a caregiver who helps to provide history due to his history of traumatic brain injury.  He reports consuming a general diet but does limit his carbs. Home exercise routine includes working outside especially doing yard work. He generally feels well. He does not have additional problems to discuss today.   Most recent fall risk assessment:    11/25/2022   10:25 AM  Fall Risk   Falls in the past year? 0  Number falls in past yr: 0  Injury with Fall? 0  Risk for fall due to : No Fall Risks     Most recent depression screenings:    11/25/2022   10:24 AM 09/06/2021    3:39 PM  PHQ 2/9 Scores  PHQ - 2 Score 0 0  PHQ- 9 Score 1 3    Patient Active Problem List   Diagnosis Date Noted   Allergic rhinitis 03/20/2021   Asthma 03/20/2021   Cannabis dependence, continuous 03/20/2021   Ganglion, right hand 03/20/2021   Esophageal reflux 03/20/2021   Insomnia 03/20/2021   Medial epicondylitis, left elbow 03/20/2021   Obesity 03/20/2021   Obstructive sleep apnea 03/20/2021   Opioid type dependence, continuous abuse 03/20/2021   Hyperlipidemia 03/20/2021   Chronic post-traumatic stress disorder 03/20/2021   Pure hypertriglyceridemia 03/20/2021   Tinnitus 03/20/2021   Umbilical hernia 03/20/2021   Elevated blood-pressure reading, without diagnosis of hypertension 11/29/2020   Carpal tunnel syndrome on right 07/28/2018   Cervical spondylosis with radiculopathy 05/22/2015    Past Surgical History:  Procedure Laterality Date   CERVICAL FUSION  2021   C4-7 ACDF   FASCIOTOMY Left    labral tear repair Right    right shoulder   POSTERIOR CERVICAL FUSION/FORAMINOTOMY N/A 04/30/2022   Procedure: POSTERIOR CERVICAL LAMINECTOMY  CERVICAL FOUR-CERVICAL SEVEN WITH LATERAL MASS FIXATION AND POSTEROLATERAL FUSION;  Surgeon: Lisbeth Renshaw, MD;  Location: [facility] OR;  Service: Neurosurgery;  Laterality: N/A;  3C   UMBILICAL HERNIA REPAIR  2016   Social History   Tobacco Use   Smoking status: Never    Passive exposure: Never   Smokeless tobacco: Never  Vaping Use   Vaping Use: Never used  Substance Use Topics   Alcohol use: No   Drug use: Yes    Types: Marijuana    Comment: daily   History reviewed. No pertinent family history. Allergies  Allergen Reactions   Buprenorphine-Naloxone Itching, Nausea And Vomiting, Other (See Comments) and Hives   Suboxone [Buprenorphine Hcl-Naloxone Hcl] Itching, Nausea And Vomiting, Hives and Other (See Comments)   Escitalopram Oxalate Nausea And Vomiting   Lexapro [Escitalopram] Rash   Sertraline Nausea Only and Nausea And Vomiting     Patient Care Team: Mayer Masker, PA-C (Inactive) as PCP - General (Physician Assistant)   Outpatient Medications Prior to Visit  Medication Sig   acetaminophen (TYLENOL) 325 MG tablet Take 2 tablets (650 mg total) by mouth every 6 (six) hours as needed for mild pain ((score 1 to 3) or temp > 100.5).   albuterol (VENTOLIN HFA) 108 (90 Base) MCG/ACT inhaler Inhale 2 puffs into the lungs every 4 (four) hours as needed for shortness of breath or wheezing (Coughing).  busPIRone (BUSPAR) 15 MG tablet Take 15 mg by mouth 3 (three) times daily.   FLUoxetine (PROZAC) 20 MG capsule Take 60 mg by mouth daily.   gabapentin (NEURONTIN) 300 MG capsule Take 900 mg by mouth 3 (three) times daily.   ibuprofen (ADVIL,MOTRIN) 800 MG tablet Take 1 tablet (800 mg total) by mouth every 8 (eight) hours as needed for mild pain or moderate pain.   methocarbamol (ROBAXIN) 500 MG tablet Take 1 tablet (500 mg total) by mouth every 6 (six) hours as needed for muscle spasms.   Omeprazole 20 MG TBDD Take 20 mg by mouth daily.   traZODone (DESYREL) 100 MG tablet Take 100  mg by mouth at bedtime.   [DISCONTINUED] atorvastatin (LIPITOR) 20 MG tablet Take 1 tablet (20 mg total) by mouth daily.   No facility-administered medications prior to visit.    Review of Systems  Constitutional:  Negative for chills, fever and malaise/fatigue.  HENT:  Negative for congestion, ear discharge, ear pain and hearing loss.   Eyes:  Negative for blurred vision and double vision.  Respiratory:  Negative for cough and shortness of breath.   Cardiovascular:  Negative for chest pain, palpitations and leg swelling.  Gastrointestinal:  Negative for abdominal pain, constipation, diarrhea and heartburn.  Genitourinary:  Negative for frequency and urgency.  Musculoskeletal:  Negative for myalgias and neck pain.  Neurological:  Negative for headaches.  Endo/Heme/Allergies:  Negative for polydipsia.  Psychiatric/Behavioral:  Negative for depression. The patient is not nervous/anxious.        Objective:    BP 126/74 (BP Location: Left Arm, Patient Position: Sitting, Cuff Size: Large)   Pulse 68   Resp 18   Ht  (1.702 m)   Wt 226 lb (102.5 kg)   SpO2 95%   BMI 35.40 kg/m    Physical Exam Constitutional:      General: He is not in acute distress.    Appearance: Normal appearance.  HENT:     Head: Normocephalic and atraumatic.     Right Ear: Tympanic membrane and ear canal normal.     Left Ear: Tympanic membrane and ear canal normal.     Nose: Nose normal.     Mouth/Throat:     Mouth: Mucous membranes are moist.     Pharynx: Oropharynx is clear.  Eyes:     Pupils: Pupils are equal, round, and reactive to light.     Comments: Wears glasses, up-to-date  Cardiovascular:     Rate and Rhythm: Normal rate and regular rhythm.     Pulses: Normal pulses.     Heart sounds: Normal heart sounds.  Pulmonary:     Effort: Pulmonary effort is normal.     Breath sounds: Normal breath sounds.  Abdominal:     General: Bowel sounds are normal.     Palpations: Abdomen is soft.   Musculoskeletal:        General: Normal range of motion.     Cervical back: Normal range of motion and neck supple.  Skin:    General: Skin is warm and dry.  Neurological:     Mental Status: He is alert and oriented to person, place, and time.  Psychiatric:        Mood and Affect: Mood normal.      No results found for any visits on 11/25/22.     Assessment & Plan:    Routine Health Maintenance and Physical Exam  Immunization History  Administered Date(s) Administered  H1N1 01/02/2009   Influenza,inj,Quad PF,6+ Mos 05/31/2016, 10/24/2017, 05/19/2020, 09/06/2021   Influenza-Unspecified 07/18/2007, 05/06/2008, 08/21/2009, 07/31/2010, 08/22/2011, 05/18/2012, 05/17/2013, 07/29/2014   Pneumococcal Polysaccharide-23 04/25/2016   Tdap 10/24/2015, 03/27/2017    Health Maintenance  Topic Date Due   COVID-19 Vaccine (1) Never done   HIV Screening  Never done   Hepatitis C Screening  Never done   COLONOSCOPY (Pts 45-42yrs Insurance coverage will need to be confirmed)  Never done   INFLUENZA VACCINE  03/13/2023   DTaP/Tdap/Td (3 - Td or Tdap) 03/28/2027   HPV VACCINES  Aged Out   We discussed indications and options for colorectal cancer screening, given that he is not completely sure of his family history of colon cancer opted for colonoscopy.  Will place referral today.  Patient requesting referral to dermatology to have a rather large milia on his left eyelid removed.  Patient currently seeing psychiatry and therapy for mood.  He states that his motivation and mood have greatly improved within the past month or so since starting amantadine for psychiatry.  Recommend continuing with both, in the future may reconsider checking testosterone levels but not indicated right now.  Patient agrees to screening for HIV and hepatitis C with blood work today.  Discussed health benefits of physical activity, and encouraged him to engage in regular exercise appropriate for his age and  condition.  Rechecking TSH and T4 given low TSH level on screening.  May need an updated referral to pulmonology due to history of burn pit exposure but will let me know.  Screening for viral disease -     Hepatitis C antibody; Future -     HIV Antibody (routine testing w rflx); Future  Low TSH level -     TSH; Future -     T4, free; Future  Hyperlipidemia, unspecified hyperlipidemia type -     Atorvastatin Calcium; Take 1 tablet (40 mg total) by mouth daily.  Dispense: 90 tablet; Refill: 3  Screening for colorectal cancer -     Ambulatory referral to Gastroenterology  Milia of eyelid of left eye -     Ambulatory referral to Dermatology    Return in about 3 months (around 02/24/2023) for fasting blood work (lab appointment only).     Melida Quitter, PA

## 2022-11-26 LAB — HEPATITIS C ANTIBODY: Hep C Virus Ab: NONREACTIVE

## 2022-11-26 LAB — TSH: TSH: 0.668 u[IU]/mL (ref 0.450–4.500)

## 2022-11-26 LAB — HIV ANTIBODY (ROUTINE TESTING W REFLEX): HIV Screen 4th Generation wRfx: NONREACTIVE

## 2022-11-26 LAB — T4, FREE: Free T4: 0.97 ng/dL (ref 0.82–1.77)

## 2022-12-16 ENCOUNTER — Telehealth: Payer: Self-pay | Admitting: *Deleted

## 2022-12-16 NOTE — Telephone Encounter (Signed)
LVM for pt to call office to inform him of below.    Received fax from [place] and request for referral has been DENIED. Fax states "millia of the eyelid is a normal finding, Removal is considered cosmetic and not covered. Will scan fax into patients chart. Contacted [facility] at [facility] and informed them as well.

## 2023-01-29 ENCOUNTER — Telehealth: Payer: Self-pay

## 2023-01-29 NOTE — Telephone Encounter (Signed)
Prescription Request  01/29/2023  LOV: 11/25/22 What is the name of the medication or equipment?    Omeprazole 20 MG TBDD  traZODone (DESYREL) 100 MG tablet   Have you contacted your pharmacy to request a refill? No   Which pharmacy would you like this sent to?  [facility] - [place], [place] - [address] [place] [address] Phone: [phone] Fax: [phone]    Patient notified that their request is being sent to the clinical staff for review and that they should receive a response within 2 business days.   Please advise at Mobile [phone] (mobile)

## 2023-01-29 NOTE — Telephone Encounter (Signed)
Per Jairo Ben, [place], patient will need an office visit to discuss medications.

## 2023-01-29 NOTE — Telephone Encounter (Signed)
LVM for wife to call back 

## 2023-03-15 ENCOUNTER — Ambulatory Visit: Payer: Self-pay

## 2023-03-17 ENCOUNTER — Telehealth: Payer: Self-pay

## 2023-03-17 DIAGNOSIS — F419 Anxiety disorder, unspecified: Secondary | ICD-10-CM

## 2023-03-17 MED ORDER — FLUOXETINE HCL 20 MG PO CAPS
60.0000 mg | ORAL_CAPSULE | Freq: Every day | ORAL | 0 refills | Status: AC
Start: 2023-03-17 — End: ?

## 2023-03-17 MED ORDER — GABAPENTIN 300 MG PO CAPS: 900.0000 mg | ORAL_CAPSULE | Freq: Three times a day (TID) | ORAL | 0 refills | Status: AC

## 2023-03-17 MED ORDER — TRAZODONE HCL 100 MG PO TABS: 100.0000 mg | ORAL_TABLET | Freq: Every day | ORAL | 0 refills | Status: AC

## 2023-03-17 NOTE — Telephone Encounter (Signed)
Prescription Request  03/17/2023  LOV: 11/25/22  What is the name of the medication or equipment?  gabapentin (NEURONTIN) 300 MG capsule  traZODone (DESYREL) 100 MG tablet  FLUoxetine (PROZAC) 20 MG capsule   Have you contacted your pharmacy to request a refill? Yes   Which pharmacy would you like this sent to?  [facility] - [place], [place] - [address] [place] [address] Phone: [phone] Fax: [phone]    Patient notified that their request is being sent to the clinical staff for review and that they should receive a response within 2 business days.   Please advise at Mobile [phone] (mobile)

## 2023-03-17 NOTE — Telephone Encounter (Signed)
2 month supply sent 

## 2023-03-17 NOTE — Addendum Note (Signed)
Addended by: Tonny Bollman on: 03/17/2023 03:32 PM   Modules accepted: Orders

## 2023-05-13 ENCOUNTER — Other Ambulatory Visit: Payer: Self-pay | Admitting: Family Medicine

## 2023-05-14 ENCOUNTER — Other Ambulatory Visit: Payer: Self-pay | Admitting: Family Medicine

## 2023-05-14 ENCOUNTER — Ambulatory Visit: Payer: No Typology Code available for payment source | Admitting: Family Medicine

## 2023-05-14 ENCOUNTER — Encounter: Payer: Self-pay | Admitting: Family Medicine

## 2023-07-03 IMAGING — XA DG INJECT/[PERSON_NAME] INC NEEDLE/CATH/PLC EPI/CERV/THOR W/IMG
2 series · 2 of 2 positions shown · non-contrast
Comparison: none

CLINICAL DATA: Cervical disc disorder at C5-6 with radiculopathy.

[Series 1: ortho standard · 1 of 1 slices shown (1 of 2)]
[im 1/1]
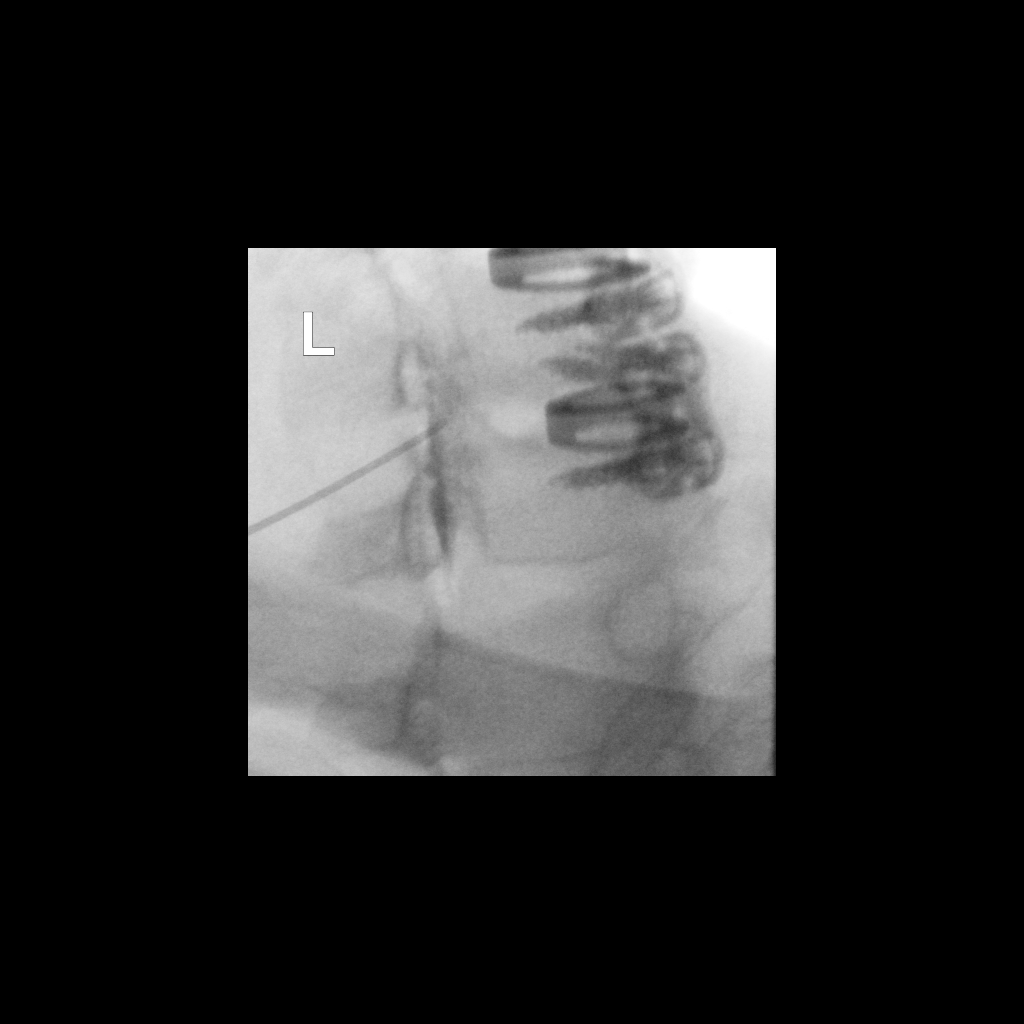

[Series 2: ortho standard · 1 of 1 slices shown (2 of 2)]
[im 1/1]
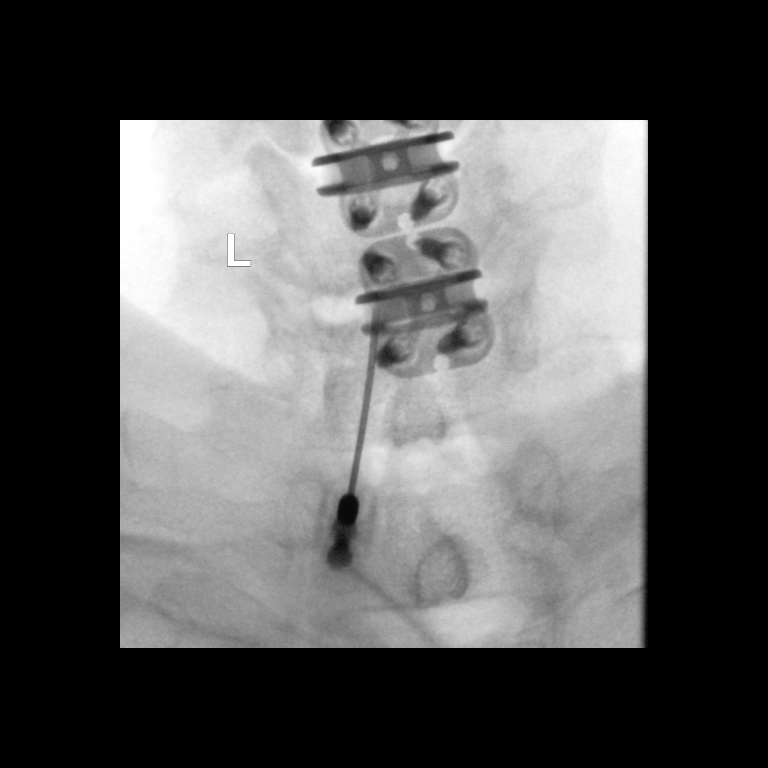

[2 of 2 positions shown; findings below may reference images not displayed]

FLUOROSCOPY TIME:  Radiation Exposure Index (as provided by the
fluoroscopic device): Dose area product 1.46 uGy*m2

PROCEDURE:
CERVICAL EPIDURAL INJECTION

An interlaminar approach was performed on the left at C6-7. A 20
gauge epidural needle was advanced using loss-of-resistance
technique.

DIAGNOSTIC EPIDURAL INJECTION

Injection of Isovue-M 300 shows a good epidural pattern with spread
above and below the level of needle placement, primarily on the
left. No vascular opacification is seen. THERAPEUTIC

EPIDURAL INJECTION

1.5 ml of Kenalog 40 mixed with 1 ml of 1% Lidocaine and 2 ml of
normal saline were then instilled. The procedure was well-tolerated,
and the patient was discharged thirty minutes following the
injection in good condition.
IMPRESSION: Technically successful third epidural injection on the left at C6-7.

## 2023-09-18 ENCOUNTER — Encounter: Payer: Self-pay | Admitting: Family Medicine
